# Patient Record
Sex: Female | Born: 1935
Health system: Southern US, Community
[De-identification: ages and names within clinical notes are randomized; demographics above are authoritative.]

## PROBLEM LIST (undated history)

## (undated) DIAGNOSIS — E119 Type 2 diabetes mellitus without complications: Secondary | ICD-10-CM

## (undated) DIAGNOSIS — I10 Essential (primary) hypertension: Secondary | ICD-10-CM

## (undated) DIAGNOSIS — E785 Hyperlipidemia, unspecified: Secondary | ICD-10-CM

## (undated) DIAGNOSIS — M359 Systemic involvement of connective tissue, unspecified: Secondary | ICD-10-CM

## (undated) DIAGNOSIS — J45909 Unspecified asthma, uncomplicated: Secondary | ICD-10-CM

## (undated) DIAGNOSIS — K219 Gastro-esophageal reflux disease without esophagitis: Secondary | ICD-10-CM

## (undated) DIAGNOSIS — C801 Malignant (primary) neoplasm, unspecified: Secondary | ICD-10-CM

## (undated) HISTORY — PX: CHOLECYSTECTOMY: SHX55

---

## 2010-08-31 ENCOUNTER — Emergency Department: Payer: Self-pay | Admitting: Emergency Medicine

## 2011-07-20 ENCOUNTER — Inpatient Hospital Stay: Payer: Self-pay | Admitting: Internal Medicine

## 2011-07-24 ENCOUNTER — Emergency Department: Payer: Self-pay | Admitting: Emergency Medicine

## 2011-12-27 ENCOUNTER — Emergency Department: Payer: Self-pay | Admitting: Emergency Medicine

## 2014-11-28 DIAGNOSIS — E1151 Type 2 diabetes mellitus with diabetic peripheral angiopathy without gangrene: Secondary | ICD-10-CM | POA: Diagnosis not present

## 2014-11-28 DIAGNOSIS — E119 Type 2 diabetes mellitus without complications: Secondary | ICD-10-CM | POA: Diagnosis not present

## 2014-11-28 DIAGNOSIS — I10 Essential (primary) hypertension: Secondary | ICD-10-CM | POA: Diagnosis not present

## 2015-01-12 DIAGNOSIS — E119 Type 2 diabetes mellitus without complications: Secondary | ICD-10-CM | POA: Diagnosis not present

## 2015-01-12 DIAGNOSIS — I10 Essential (primary) hypertension: Secondary | ICD-10-CM | POA: Diagnosis not present

## 2015-01-12 DIAGNOSIS — G309 Alzheimer's disease, unspecified: Secondary | ICD-10-CM | POA: Diagnosis not present

## 2015-01-12 DIAGNOSIS — K219 Gastro-esophageal reflux disease without esophagitis: Secondary | ICD-10-CM | POA: Diagnosis not present

## 2015-01-12 DIAGNOSIS — E785 Hyperlipidemia, unspecified: Secondary | ICD-10-CM | POA: Diagnosis not present

## 2015-02-06 DIAGNOSIS — D485 Neoplasm of uncertain behavior of skin: Secondary | ICD-10-CM | POA: Diagnosis not present

## 2015-02-06 DIAGNOSIS — D2339 Other benign neoplasm of skin of other parts of face: Secondary | ICD-10-CM | POA: Diagnosis not present

## 2015-02-06 DIAGNOSIS — L089 Local infection of the skin and subcutaneous tissue, unspecified: Secondary | ICD-10-CM | POA: Diagnosis not present

## 2015-04-04 DIAGNOSIS — R262 Difficulty in walking, not elsewhere classified: Secondary | ICD-10-CM | POA: Diagnosis not present

## 2015-04-04 DIAGNOSIS — E119 Type 2 diabetes mellitus without complications: Secondary | ICD-10-CM | POA: Diagnosis not present

## 2015-04-04 DIAGNOSIS — G309 Alzheimer's disease, unspecified: Secondary | ICD-10-CM | POA: Diagnosis not present

## 2015-04-04 DIAGNOSIS — I10 Essential (primary) hypertension: Secondary | ICD-10-CM | POA: Diagnosis not present

## 2015-04-04 DIAGNOSIS — K219 Gastro-esophageal reflux disease without esophagitis: Secondary | ICD-10-CM | POA: Diagnosis not present

## 2015-05-15 DIAGNOSIS — E1151 Type 2 diabetes mellitus with diabetic peripheral angiopathy without gangrene: Secondary | ICD-10-CM | POA: Diagnosis not present

## 2015-05-15 DIAGNOSIS — E119 Type 2 diabetes mellitus without complications: Secondary | ICD-10-CM | POA: Diagnosis not present

## 2015-05-15 DIAGNOSIS — Z79899 Other long term (current) drug therapy: Secondary | ICD-10-CM | POA: Diagnosis not present

## 2015-05-15 DIAGNOSIS — I10 Essential (primary) hypertension: Secondary | ICD-10-CM | POA: Diagnosis not present

## 2015-08-08 DIAGNOSIS — E119 Type 2 diabetes mellitus without complications: Secondary | ICD-10-CM | POA: Diagnosis not present

## 2015-08-08 DIAGNOSIS — I10 Essential (primary) hypertension: Secondary | ICD-10-CM | POA: Diagnosis not present

## 2015-08-08 DIAGNOSIS — R262 Difficulty in walking, not elsewhere classified: Secondary | ICD-10-CM | POA: Diagnosis not present

## 2015-08-08 DIAGNOSIS — I2510 Atherosclerotic heart disease of native coronary artery without angina pectoris: Secondary | ICD-10-CM | POA: Diagnosis not present

## 2015-08-08 DIAGNOSIS — K219 Gastro-esophageal reflux disease without esophagitis: Secondary | ICD-10-CM | POA: Diagnosis not present

## 2015-08-08 DIAGNOSIS — Z23 Encounter for immunization: Secondary | ICD-10-CM | POA: Diagnosis not present

## 2015-08-18 DIAGNOSIS — H2513 Age-related nuclear cataract, bilateral: Secondary | ICD-10-CM | POA: Diagnosis not present

## 2015-09-05 DIAGNOSIS — E119 Type 2 diabetes mellitus without complications: Secondary | ICD-10-CM | POA: Diagnosis not present

## 2015-09-05 DIAGNOSIS — I10 Essential (primary) hypertension: Secondary | ICD-10-CM | POA: Diagnosis not present

## 2015-09-05 DIAGNOSIS — E1151 Type 2 diabetes mellitus with diabetic peripheral angiopathy without gangrene: Secondary | ICD-10-CM | POA: Diagnosis not present

## 2015-09-05 DIAGNOSIS — M810 Age-related osteoporosis without current pathological fracture: Secondary | ICD-10-CM | POA: Diagnosis not present

## 2015-09-05 DIAGNOSIS — E785 Hyperlipidemia, unspecified: Secondary | ICD-10-CM | POA: Diagnosis not present

## 2015-09-08 ENCOUNTER — Ambulatory Visit: Payer: Self-pay | Admitting: Sports Medicine

## 2016-02-06 DIAGNOSIS — I10 Essential (primary) hypertension: Secondary | ICD-10-CM | POA: Diagnosis not present

## 2016-02-06 DIAGNOSIS — E119 Type 2 diabetes mellitus without complications: Secondary | ICD-10-CM | POA: Diagnosis not present

## 2016-02-06 DIAGNOSIS — R3121 Asymptomatic microscopic hematuria: Secondary | ICD-10-CM | POA: Diagnosis not present

## 2016-06-11 DIAGNOSIS — I10 Essential (primary) hypertension: Secondary | ICD-10-CM | POA: Diagnosis not present

## 2016-06-11 DIAGNOSIS — E1151 Type 2 diabetes mellitus with diabetic peripheral angiopathy without gangrene: Secondary | ICD-10-CM | POA: Diagnosis not present

## 2016-06-11 DIAGNOSIS — Z79899 Other long term (current) drug therapy: Secondary | ICD-10-CM | POA: Diagnosis not present

## 2016-06-11 DIAGNOSIS — E119 Type 2 diabetes mellitus without complications: Secondary | ICD-10-CM | POA: Diagnosis not present

## 2016-07-23 DIAGNOSIS — G4700 Insomnia, unspecified: Secondary | ICD-10-CM | POA: Diagnosis not present

## 2016-07-23 DIAGNOSIS — R609 Edema, unspecified: Secondary | ICD-10-CM | POA: Diagnosis not present

## 2016-07-23 DIAGNOSIS — E119 Type 2 diabetes mellitus without complications: Secondary | ICD-10-CM | POA: Diagnosis not present

## 2017-06-19 DIAGNOSIS — I2510 Atherosclerotic heart disease of native coronary artery without angina pectoris: Secondary | ICD-10-CM | POA: Diagnosis not present

## 2017-06-19 DIAGNOSIS — K219 Gastro-esophageal reflux disease without esophagitis: Secondary | ICD-10-CM | POA: Diagnosis not present

## 2017-06-19 DIAGNOSIS — I10 Essential (primary) hypertension: Secondary | ICD-10-CM | POA: Diagnosis not present

## 2017-06-19 DIAGNOSIS — G3184 Mild cognitive impairment, so stated: Secondary | ICD-10-CM | POA: Diagnosis not present

## 2017-06-19 DIAGNOSIS — E119 Type 2 diabetes mellitus without complications: Secondary | ICD-10-CM | POA: Diagnosis not present

## 2017-11-03 ENCOUNTER — Emergency Department: Payer: Medicare Other

## 2017-11-03 ENCOUNTER — Other Ambulatory Visit: Payer: Self-pay

## 2017-11-03 ENCOUNTER — Encounter: Payer: Self-pay | Admitting: *Deleted

## 2017-11-03 ENCOUNTER — Inpatient Hospital Stay
Admission: EM | Admit: 2017-11-03 | Discharge: 2017-11-10 | DRG: 690 | Disposition: A | Discharge status: Skilled Nursing Facility | Payer: Medicare Other | Attending: Internal Medicine | Admitting: Internal Medicine

## 2017-11-03 DIAGNOSIS — R748 Abnormal levels of other serum enzymes: Secondary | ICD-10-CM | POA: Diagnosis not present

## 2017-11-03 DIAGNOSIS — Z880 Allergy status to penicillin: Secondary | ICD-10-CM

## 2017-11-03 DIAGNOSIS — K219 Gastro-esophageal reflux disease without esophagitis: Secondary | ICD-10-CM | POA: Diagnosis not present

## 2017-11-03 DIAGNOSIS — I5030 Unspecified diastolic (congestive) heart failure: Secondary | ICD-10-CM | POA: Diagnosis not present

## 2017-11-03 DIAGNOSIS — R410 Disorientation, unspecified: Secondary | ICD-10-CM | POA: Diagnosis not present

## 2017-11-03 DIAGNOSIS — Z79899 Other long term (current) drug therapy: Secondary | ICD-10-CM | POA: Diagnosis not present

## 2017-11-03 DIAGNOSIS — I10 Essential (primary) hypertension: Secondary | ICD-10-CM | POA: Diagnosis present

## 2017-11-03 DIAGNOSIS — M793 Panniculitis, unspecified: Secondary | ICD-10-CM | POA: Diagnosis present

## 2017-11-03 DIAGNOSIS — R2681 Unsteadiness on feet: Secondary | ICD-10-CM | POA: Diagnosis not present

## 2017-11-03 DIAGNOSIS — Z91013 Allergy to seafood: Secondary | ICD-10-CM

## 2017-11-03 DIAGNOSIS — Z794 Long term (current) use of insulin: Secondary | ICD-10-CM | POA: Diagnosis not present

## 2017-11-03 DIAGNOSIS — R1312 Dysphagia, oropharyngeal phase: Secondary | ICD-10-CM | POA: Diagnosis not present

## 2017-11-03 DIAGNOSIS — M359 Systemic involvement of connective tissue, unspecified: Secondary | ICD-10-CM | POA: Diagnosis present

## 2017-11-03 DIAGNOSIS — I69311 Memory deficit following cerebral infarction: Secondary | ICD-10-CM | POA: Diagnosis not present

## 2017-11-03 DIAGNOSIS — R7989 Other specified abnormal findings of blood chemistry: Secondary | ICD-10-CM | POA: Diagnosis not present

## 2017-11-03 DIAGNOSIS — E119 Type 2 diabetes mellitus without complications: Secondary | ICD-10-CM | POA: Diagnosis not present

## 2017-11-03 DIAGNOSIS — Z23 Encounter for immunization: Secondary | ICD-10-CM

## 2017-11-03 DIAGNOSIS — N390 Urinary tract infection, site not specified: Secondary | ICD-10-CM | POA: Diagnosis not present

## 2017-11-03 DIAGNOSIS — Z7401 Bed confinement status: Secondary | ICD-10-CM | POA: Diagnosis not present

## 2017-11-03 DIAGNOSIS — Z6841 Body Mass Index (BMI) 40.0 and over, adult: Secondary | ICD-10-CM

## 2017-11-03 DIAGNOSIS — B372 Candidiasis of skin and nail: Secondary | ICD-10-CM | POA: Diagnosis present

## 2017-11-03 DIAGNOSIS — E785 Hyperlipidemia, unspecified: Secondary | ICD-10-CM | POA: Diagnosis not present

## 2017-11-03 DIAGNOSIS — F0150 Vascular dementia without behavioral disturbance: Secondary | ICD-10-CM | POA: Diagnosis present

## 2017-11-03 DIAGNOSIS — R778 Other specified abnormalities of plasma proteins: Secondary | ICD-10-CM

## 2017-11-03 DIAGNOSIS — R4020 Unspecified coma: Secondary | ICD-10-CM | POA: Diagnosis not present

## 2017-11-03 DIAGNOSIS — B379 Candidiasis, unspecified: Secondary | ICD-10-CM | POA: Diagnosis present

## 2017-11-03 DIAGNOSIS — J45909 Unspecified asthma, uncomplicated: Secondary | ICD-10-CM | POA: Diagnosis not present

## 2017-11-03 DIAGNOSIS — R0602 Shortness of breath: Secondary | ICD-10-CM | POA: Diagnosis not present

## 2017-11-03 DIAGNOSIS — R4182 Altered mental status, unspecified: Secondary | ICD-10-CM

## 2017-11-03 DIAGNOSIS — R21 Rash and other nonspecific skin eruption: Secondary | ICD-10-CM | POA: Diagnosis not present

## 2017-11-03 HISTORY — DX: Malignant (primary) neoplasm, unspecified: C80.1

## 2017-11-03 HISTORY — DX: Type 2 diabetes mellitus without complications: E11.9

## 2017-11-03 HISTORY — DX: Essential (primary) hypertension: I10

## 2017-11-03 HISTORY — DX: Hyperlipidemia, unspecified: E78.5

## 2017-11-03 HISTORY — DX: Systemic involvement of connective tissue, unspecified: M35.9

## 2017-11-03 HISTORY — DX: Gastro-esophageal reflux disease without esophagitis: K21.9

## 2017-11-03 HISTORY — DX: Unspecified asthma, uncomplicated: J45.909

## 2017-11-03 LAB — BASIC METABOLIC PANEL
Anion gap: 10 (ref 5–15)
BUN: 21 mg/dL — AB (ref 6–20)
CO2: 26 mmol/L (ref 22–32)
CREATININE: 0.78 mg/dL (ref 0.44–1.00)
Calcium: 9.2 mg/dL (ref 8.9–10.3)
Chloride: 107 mmol/L (ref 101–111)
GFR calc Af Amer: >60 mL/min (ref >60)
GFR calc non Af Amer: >60 mL/min (ref >60)
GLUCOSE: 124 mg/dL — AB (ref 65–99)
Potassium: 4.2 mmol/L (ref 3.5–5.1)
Sodium: 143 mmol/L (ref 135–145)

## 2017-11-03 LAB — URINALYSIS, COMPLETE (UACMP) WITH MICROSCOPIC
BILIRUBIN URINE: NEGATIVE
Glucose, UA: NEGATIVE mg/dL
Hgb urine dipstick: NEGATIVE
KETONES UR: NEGATIVE mg/dL
Nitrite: NEGATIVE
Protein, ur: 30 mg/dL — AB
SPECIFIC GRAVITY, URINE: 1.026 (ref 1.005–1.030)
pH: 5 (ref 5.0–8.0)

## 2017-11-03 LAB — DIFFERENTIAL
BASOS ABS: 0.1 10*3/uL (ref 0–0.1)
BASOS PCT: 1 %
EOS ABS: 0.2 10*3/uL (ref 0–0.7)
Eosinophils Relative: 1 %
LYMPHS ABS: 2.3 10*3/uL (ref 1.0–3.6)
Lymphocytes Relative: 19 %
Monocytes Absolute: 1 10*3/uL — ABNORMAL HIGH (ref 0.2–0.9)
Monocytes Relative: 8 %
NEUTROS PCT: 71 %
Neutro Abs: 8.8 10*3/uL — ABNORMAL HIGH (ref 1.4–6.5)

## 2017-11-03 LAB — CBC
HCT: 36.7 % (ref 35.0–47.0)
Hemoglobin: 12.1 g/dL (ref 12.0–16.0)
MCH: 29.7 pg (ref 26.0–34.0)
MCHC: 32.8 g/dL (ref 32.0–36.0)
MCV: 90.3 fL (ref 80.0–100.0)
PLATELETS: 300 10*3/uL (ref 150–440)
RBC: 4.07 MIL/uL (ref 3.80–5.20)
RDW: 14.9 % — AB (ref 11.5–14.5)
WBC: 12 10*3/uL — ABNORMAL HIGH (ref 3.6–11.0)

## 2017-11-03 LAB — HEPATIC FUNCTION PANEL
ALT: 11 U/L — AB (ref 14–54)
AST: 22 U/L (ref 15–41)
Albumin: 3 g/dL — ABNORMAL LOW (ref 3.5–5.0)
Alkaline Phosphatase: 57 U/L (ref 38–126)
Total Bilirubin: 0.6 mg/dL (ref 0.3–1.2)
Total Protein: 6.5 g/dL (ref 6.5–8.1)

## 2017-11-03 LAB — TROPONIN I: Troponin I: 0.09 ng/mL (ref <0.03)

## 2017-11-03 LAB — LIPASE, BLOOD: LIPASE: 20 U/L (ref 11–51)

## 2017-11-03 LAB — BRAIN NATRIURETIC PEPTIDE: B Natriuretic Peptide: 436 pg/mL — ABNORMAL HIGH (ref 0.0–100.0)

## 2017-11-03 MED ORDER — IOPAMIDOL (ISOVUE-370) INJECTION 76%
100.0000 mL | Freq: Once | INTRAVENOUS | Status: AC | PRN
  Administered 2017-11-03: 100 mL via INTRAVENOUS

## 2017-11-03 MED ORDER — IOPAMIDOL (ISOVUE-300) INJECTION 61%
30.0000 mL | Freq: Once | INTRAVENOUS | Status: AC
  Administered 2017-11-03: 30 mL via ORAL

## 2017-11-03 MED ORDER — LORAZEPAM 2 MG/ML IJ SOLN
1.0000 mg | Freq: Once | INTRAMUSCULAR | Status: AC
  Administered 2017-11-03: 1 mg via INTRAVENOUS

## 2017-11-03 MED ORDER — LORAZEPAM 2 MG/ML IJ SOLN
INTRAMUSCULAR | Status: AC
  Administered 2017-11-03: 1 mg via INTRAVENOUS
  Filled 2017-11-03: qty 1

## 2017-11-03 MED ORDER — CIPROFLOXACIN IN D5W 400 MG/200ML IV SOLN
400.0000 mg | Freq: Once | INTRAVENOUS | Status: AC
  Administered 2017-11-03: 400 mg via INTRAVENOUS
  Filled 2017-11-03: qty 200

## 2017-11-03 NOTE — ED Notes (Signed)
This Rn received call from MRI, stating they were unable to get abd scan as pt was not agreeable, and they obtained a head but quality is not good. EDP aware.

## 2017-11-03 NOTE — ED Provider Notes (Addendum)
[facility] Emergency Department Provider Note   ____________________________________________   First MD Initiated Contact with Patient 11/03/17 2001     (approximate)  I have reviewed the triage vital signs and the nursing notes.   HISTORY  Chief Complaint Altered Mental Status and Back Pain    HPI Heidi Hanna is a 84 y.o. female Patient complaining of low back pain in the right SI joint area for about a month. Patient has a intermittent and currently fairly severe yeast rash in her under her panniculus. Patient has not been eating well for about a week is not acting like herself per her daughter. She is been intermittently confused for about a week. She has a chronic dry cough from her medication per her daughter. She is not running a fever she is not vomiting not complaining of any other pain.   Past Medical History:  Diagnosis Date  . Asthma   . Cancer ([place])   . Collagen vascular disease ([place])   . Diabetes mellitus without complication ([place])   . Hypertension     There are no active problems to display for this patient.   History reviewed. No pertinent surgical history.  Prior to Admission medications   Not on File    Allergies Shellfish allergy and Penicillins  History reviewed. No pertinent family history.  Social History Social History   Tobacco Use  . Smoking status: Never Smoker  . Smokeless tobacco: Never Used  Substance Use Topics  . Alcohol use: No    Frequency: Never  . Drug use: No    Review of Systems  Constitutional: No fever/chills Eyes: No visual changes. ENT: No sore throat. Cardiovascular: Denies chest pain. Respiratory: Denies shortness of breath. Gastrointestinal: No abdominal pain, no vomiting.  No diarrhea.  No constipation. Genitourinary: Negative for dysuria. Musculoskeletal:  back pain. Skin:  rash. Neurological: Negative for headaches, focal  weakness  ____________________________________________   PHYSICAL EXAM:  VITAL SIGNS: ED Triage Vitals [11/03/17 1638]  Enc Vitals Group     BP 134/60     Pulse Rate 61     Resp      Temp 97.6 F (36.4 C)     Temp Source Oral     SpO2 96 %     Weight      Height 5\' 3"  (1.6 m)     Head Circumference      Peak Flow      Pain Score      Pain Loc      Pain Edu?      Excl. in [place]?     Constitutional: Alert and oriented. Well appearing and in no acute distress. Eyes: Conjunctivae are normal.  Head: Atraumatic. Nose: No congestion/rhinnorhea. Mouth/Throat: Mucous membranes are moist.  Oropharynx non-erythematous. Neck: No stridor.  Cardiovascular: Normal rate, regular rhythm. Grossly normal heart sounds.  Good peripheral circulation. Respiratory: Normal respiratory effort.  No retractions. Lungs CTAB. Gastrointestinal: Soft and nontender. No distention. No abdominal bruits. No CVA tenderness. Musculoskeletal: No lower extremity tenderness nor edema.  No joint effusions. Neurologic:  Normal speech and language. No gross focal neurologic deficits are appreciated.  Skin:  patient has rash is described in history of present illness Psychiatric: Mood and affect are normal. Speech and behavior are normal.  ____________________________________________   LABS (all labs ordered are listed, but only abnormal results are displayed)  Labs Reviewed  BASIC METABOLIC PANEL - Abnormal; Notable for the following components:      Result Value  Glucose, Bld 124 (*)    BUN 21 (*)    All other components within normal limits  CBC - Abnormal; Notable for the following components:   WBC 12.0 (*)    RDW 14.9 (*)    All other components within normal limits  HEPATIC FUNCTION PANEL - Abnormal; Notable for the following components:   Albumin 3.0 (*)    ALT 11 (*)    Bilirubin, Direct <0.1 (*)    All other components within normal limits  TROPONIN I - Abnormal; Notable for the following  components:   Troponin I 0.09 (*)    All other components within normal limits  BRAIN NATRIURETIC PEPTIDE - Abnormal; Notable for the following components:   B Natriuretic Peptide 436.0 (*)    All other components within normal limits  URINALYSIS, COMPLETE (UACMP) WITH MICROSCOPIC - Abnormal; Notable for the following components:   Color, Urine AMBER (*)    APPearance TURBID (*)    Protein, ur 30 (*)    Leukocytes, UA LARGE (*)    Bacteria, UA MANY (*)    Squamous Epithelial / LPF TOO NUMEROUS TO COUNT (*)    All other components within normal limits  DIFFERENTIAL - Abnormal; Notable for the following components:   Neutro Abs 8.8 (*)    Monocytes Absolute 1.0 (*)    All other components within normal limits  CULTURE, BLOOD (ROUTINE X 2)  CULTURE, BLOOD (ROUTINE X 2)  URINE CULTURE  LIPASE, BLOOD   ____________________________________________  EKG  EKG read and interpreted by me shows normal sinus rhythm rate of 77 normal axis there are some PVCs present but no acute ST-T wave changes ____________________________________________  RADIOLOGY  ichest x-ray read as no acute disease _______CT of the head deflated by motion but shows no acute disease_____________________________________   PROCEDURES   Procedures  Critical Care performed:   ____________________________________________   INITIAL IMPRESSION / ASSESSMENT AND PLAN / ED COURSE  patient able to tolerate CT scan even with Ativan on board. We'll admit her and treat her UTI. The CT was mainly to help discover the etiology of her right sacroiliac area pain.we will also evaluate the elevated troponin and the altered mental status.      ____________________________________________   FINAL CLINICAL IMPRESSION(S) / ED DIAGNOSES  Final diagnoses:  Urinary tract infection without hematuria, site unspecified  Altered mental status, unspecified altered mental status type  Elevated troponin  Elevated brain  natriuretic peptide (BNP) level     ED Discharge Orders    None       Note:  This document was prepared using Dragon voice recognition software and may include unintentional dictation errors.    Nena Polio, MD 11/03/17 2330    Nena Polio, MD 11/03/17 [phone]

## 2017-11-03 NOTE — ED Notes (Signed)
Date and time results received: 11/03/17 2108   Test: Troponin Critical Value: 0.09  Name of Provider Notified: Cinda Quest

## 2017-11-03 NOTE — ED Triage Notes (Signed)
Pt brought in via wheelchair.  Daughter with pt.  Daughter states pt has low back pain.  No injury to back no urinary sx. Daughter states pt has periods of confusion for 1 week.  Pt alert in triage.

## 2017-11-03 NOTE — ED Notes (Signed)
notified lab of add on

## 2017-11-03 NOTE — ED Notes (Signed)
ED Provider at bedside. 

## 2017-11-03 NOTE — ED Notes (Signed)
This Rn spoke with Alisha in Lab and notified of urine culture order that was just placed.

## 2017-11-03 NOTE — ED Notes (Addendum)
Pt was assisted with bedpan.

## 2017-11-03 NOTE — ED Notes (Signed)
Patient transported to CT 

## 2017-11-04 ENCOUNTER — Other Ambulatory Visit: Payer: Self-pay

## 2017-11-04 ENCOUNTER — Inpatient Hospital Stay: Admit: 2017-11-04 | Payer: Medicare Other

## 2017-11-04 ENCOUNTER — Encounter: Payer: Self-pay | Admitting: Internal Medicine

## 2017-11-04 DIAGNOSIS — Z794 Long term (current) use of insulin: Secondary | ICD-10-CM | POA: Diagnosis not present

## 2017-11-04 DIAGNOSIS — Z6841 Body Mass Index (BMI) 40.0 and over, adult: Secondary | ICD-10-CM | POA: Diagnosis not present

## 2017-11-04 DIAGNOSIS — K219 Gastro-esophageal reflux disease without esophagitis: Secondary | ICD-10-CM | POA: Diagnosis present

## 2017-11-04 DIAGNOSIS — B372 Candidiasis of skin and nail: Secondary | ICD-10-CM | POA: Diagnosis present

## 2017-11-04 DIAGNOSIS — F0150 Vascular dementia without behavioral disturbance: Secondary | ICD-10-CM | POA: Diagnosis present

## 2017-11-04 DIAGNOSIS — Z880 Allergy status to penicillin: Secondary | ICD-10-CM | POA: Diagnosis not present

## 2017-11-04 DIAGNOSIS — I10 Essential (primary) hypertension: Secondary | ICD-10-CM | POA: Diagnosis present

## 2017-11-04 DIAGNOSIS — Z23 Encounter for immunization: Secondary | ICD-10-CM | POA: Diagnosis not present

## 2017-11-04 DIAGNOSIS — R748 Abnormal levels of other serum enzymes: Secondary | ICD-10-CM | POA: Diagnosis present

## 2017-11-04 DIAGNOSIS — E119 Type 2 diabetes mellitus without complications: Secondary | ICD-10-CM

## 2017-11-04 DIAGNOSIS — B379 Candidiasis, unspecified: Secondary | ICD-10-CM | POA: Diagnosis present

## 2017-11-04 DIAGNOSIS — N390 Urinary tract infection, site not specified: Secondary | ICD-10-CM | POA: Diagnosis present

## 2017-11-04 DIAGNOSIS — M359 Systemic involvement of connective tissue, unspecified: Secondary | ICD-10-CM | POA: Diagnosis present

## 2017-11-04 DIAGNOSIS — I5030 Unspecified diastolic (congestive) heart failure: Secondary | ICD-10-CM | POA: Diagnosis not present

## 2017-11-04 DIAGNOSIS — I69311 Memory deficit following cerebral infarction: Secondary | ICD-10-CM | POA: Diagnosis not present

## 2017-11-04 DIAGNOSIS — M793 Panniculitis, unspecified: Secondary | ICD-10-CM

## 2017-11-04 DIAGNOSIS — Z79899 Other long term (current) drug therapy: Secondary | ICD-10-CM | POA: Diagnosis not present

## 2017-11-04 DIAGNOSIS — E785 Hyperlipidemia, unspecified: Secondary | ICD-10-CM | POA: Diagnosis present

## 2017-11-04 DIAGNOSIS — Z91013 Allergy to seafood: Secondary | ICD-10-CM | POA: Diagnosis not present

## 2017-11-04 DIAGNOSIS — J45909 Unspecified asthma, uncomplicated: Secondary | ICD-10-CM | POA: Diagnosis present

## 2017-11-04 DIAGNOSIS — R7989 Other specified abnormal findings of blood chemistry: Secondary | ICD-10-CM | POA: Diagnosis present

## 2017-11-04 LAB — GLUCOSE, CAPILLARY
GLUCOSE-CAPILLARY: 61 mg/dL — AB (ref 65–99)
GLUCOSE-CAPILLARY: 124 mg/dL — AB (ref 65–99)
GLUCOSE-CAPILLARY: 149 mg/dL — AB (ref 65–99)
Glucose-Capillary: 130 mg/dL — ABNORMAL HIGH (ref 65–99)
Glucose-Capillary: 135 mg/dL — ABNORMAL HIGH (ref 65–99)

## 2017-11-04 LAB — BLOOD CULTURE ID PANEL (REFLEXED)
ACINETOBACTER BAUMANNII: NOT DETECTED
CANDIDA GLABRATA: NOT DETECTED
CANDIDA PARAPSILOSIS: NOT DETECTED
Candida albicans: NOT DETECTED
Candida krusei: NOT DETECTED
Candida tropicalis: NOT DETECTED
Enterobacter cloacae complex: NOT DETECTED
Enterobacteriaceae species: NOT DETECTED
Enterococcus species: NOT DETECTED
Escherichia coli: NOT DETECTED
HAEMOPHILUS INFLUENZAE: NOT DETECTED
KLEBSIELLA OXYTOCA: NOT DETECTED
KLEBSIELLA PNEUMONIAE: NOT DETECTED
Listeria monocytogenes: NOT DETECTED
METHICILLIN RESISTANCE: NOT DETECTED
NEISSERIA MENINGITIDIS: NOT DETECTED
PSEUDOMONAS AERUGINOSA: NOT DETECTED
Proteus species: NOT DETECTED
STREPTOCOCCUS PNEUMONIAE: NOT DETECTED
STREPTOCOCCUS PYOGENES: NOT DETECTED
STREPTOCOCCUS SPECIES: NOT DETECTED
Serratia marcescens: NOT DETECTED
Staphylococcus aureus (BCID): NOT DETECTED
Staphylococcus species: DETECTED — AB
Streptococcus agalactiae: NOT DETECTED

## 2017-11-04 LAB — BASIC METABOLIC PANEL
Anion gap: 9 (ref 5–15)
BUN: 16 mg/dL (ref 6–20)
CALCIUM: 8.5 mg/dL — AB (ref 8.9–10.3)
CHLORIDE: 103 mmol/L (ref 101–111)
CO2: 27 mmol/L (ref 22–32)
CREATININE: 0.68 mg/dL (ref 0.44–1.00)
GFR calc non Af Amer: >60 mL/min (ref >60)
GLUCOSE: 72 mg/dL (ref 65–99)
Potassium: 3.3 mmol/L — ABNORMAL LOW (ref 3.5–5.1)
Sodium: 139 mmol/L (ref 135–145)

## 2017-11-04 LAB — CBC
HCT: 34.5 % — ABNORMAL LOW (ref 35.0–47.0)
Hemoglobin: 11.5 g/dL — ABNORMAL LOW (ref 12.0–16.0)
MCH: 30 pg (ref 26.0–34.0)
MCHC: 33.4 g/dL (ref 32.0–36.0)
MCV: 90 fL (ref 80.0–100.0)
PLATELETS: 306 10*3/uL (ref 150–440)
RBC: 3.83 MIL/uL (ref 3.80–5.20)
RDW: 14.8 % — AB (ref 11.5–14.5)
WBC: 12.5 10*3/uL — ABNORMAL HIGH (ref 3.6–11.0)

## 2017-11-04 MED ORDER — MORPHINE SULFATE (PF) 2 MG/ML IV SOLN: 2.0000 mg | Freq: Once | INTRAVENOUS | Status: DC

## 2017-11-04 MED ORDER — MORPHINE SULFATE (PF) 2 MG/ML IV SOLN
INTRAVENOUS | Status: AC
  Filled 2017-11-04: qty 1

## 2017-11-04 MED ORDER — ENOXAPARIN SODIUM 40 MG/0.4ML ~~LOC~~ SOLN
40.0000 mg | SUBCUTANEOUS | Status: DC
  Administered 2017-11-04: 40 mg via SUBCUTANEOUS
  Filled 2017-11-04: qty 0.4

## 2017-11-04 MED ORDER — ONDANSETRON HCL 4 MG/2ML IJ SOLN: 4.0000 mg | Freq: Four times a day (QID) | INTRAMUSCULAR | Status: DC | PRN

## 2017-11-04 MED ORDER — POTASSIUM CHLORIDE 20 MEQ PO PACK
40.0000 meq | PACK | Freq: Once | ORAL | Status: AC
  Administered 2017-11-04: 40 meq via ORAL
  Filled 2017-11-04: qty 2

## 2017-11-04 MED ORDER — CEFAZOLIN SODIUM-DEXTROSE 2-4 GM/100ML-% IV SOLN
2.0000 g | Freq: Three times a day (TID) | INTRAVENOUS | Status: DC
  Administered 2017-11-05 – 2017-11-07 (×7): 2 g via INTRAVENOUS
  Filled 2017-11-04 (×11): qty 100

## 2017-11-04 MED ORDER — HYDRALAZINE HCL 25 MG PO TABS
25.0000 mg | ORAL_TABLET | Freq: Two times a day (BID) | ORAL | Status: DC
  Administered 2017-11-04 – 2017-11-10 (×12): 25 mg via ORAL
  Filled 2017-11-04 (×13): qty 1

## 2017-11-04 MED ORDER — PREMIER PROTEIN SHAKE
11.0000 [oz_av] | Freq: Two times a day (BID) | ORAL | Status: DC
  Administered 2017-11-04 – 2017-11-10 (×13): 11 [oz_av] via ORAL

## 2017-11-04 MED ORDER — MORPHINE SULFATE (PF) 4 MG/ML IV SOLN
4.0000 mg | INTRAVENOUS | Status: DC | PRN
  Administered 2017-11-09: 4 mg via INTRAVENOUS
  Filled 2017-11-04: qty 1

## 2017-11-04 MED ORDER — NYSTATIN 100000 UNIT/GM EX POWD
Freq: Three times a day (TID) | CUTANEOUS | Status: DC
  Administered 2017-11-04 – 2017-11-05 (×5): via TOPICAL
  Filled 2017-11-04 (×2): qty 15

## 2017-11-04 MED ORDER — ADULT MULTIVITAMIN W/MINERALS CH
1.0000 | ORAL_TABLET | Freq: Every day | ORAL | Status: DC
  Administered 2017-11-05 – 2017-11-10 (×6): 1 via ORAL
  Filled 2017-11-04 (×6): qty 1

## 2017-11-04 MED ORDER — ONDANSETRON HCL 4 MG PO TABS: 4.0000 mg | ORAL_TABLET | Freq: Four times a day (QID) | ORAL | Status: DC | PRN

## 2017-11-04 MED ORDER — OXYCODONE HCL 5 MG PO TABS
5.0000 mg | ORAL_TABLET | ORAL | Status: DC | PRN
  Administered 2017-11-04 – 2017-11-08 (×6): 5 mg via ORAL
  Filled 2017-11-04 (×6): qty 1

## 2017-11-04 MED ORDER — DEXTROSE 5 % IV SOLN
1.0000 g | INTRAVENOUS | Status: DC
  Administered 2017-11-04: 1 g via INTRAVENOUS
  Filled 2017-11-04 (×3): qty 10

## 2017-11-04 MED ORDER — LOSARTAN POTASSIUM 50 MG PO TABS
100.0000 mg | ORAL_TABLET | Freq: Every day | ORAL | Status: DC
  Administered 2017-11-04 – 2017-11-10 (×7): 100 mg via ORAL
  Filled 2017-11-04 (×7): qty 2

## 2017-11-04 MED ORDER — DEXTROSE 5 % IV SOLN
2.0000 g | Freq: Three times a day (TID) | INTRAVENOUS | Status: DC
  Filled 2017-11-04 (×2): qty 2000

## 2017-11-04 MED ORDER — INSULIN ASPART 100 UNIT/ML ~~LOC~~ SOLN
0.0000 [IU] | Freq: Every day | SUBCUTANEOUS | Status: DC
  Administered 2017-11-06: 3 [IU] via SUBCUTANEOUS
  Administered 2017-11-07 – 2017-11-09 (×2): 2 [IU] via SUBCUTANEOUS
  Filled 2017-11-04 (×3): qty 1

## 2017-11-04 MED ORDER — INSULIN LISPRO 100 UNIT/ML (KWIKPEN): 5.0000 [IU] | PEN_INJECTOR | Freq: Three times a day (TID) | SUBCUTANEOUS | Status: DC

## 2017-11-04 MED ORDER — INFLUENZA VAC SPLIT HIGH-DOSE 0.5 ML IM SUSY
0.5000 mL | PREFILLED_SYRINGE | INTRAMUSCULAR | Status: AC
  Administered 2017-11-08: 0.5 mL via INTRAMUSCULAR
  Filled 2017-11-04: qty 0.5

## 2017-11-04 MED ORDER — METOPROLOL SUCCINATE ER 50 MG PO TB24
200.0000 mg | ORAL_TABLET | Freq: Every day | ORAL | Status: DC
  Administered 2017-11-04 – 2017-11-09 (×5): 200 mg via ORAL
  Filled 2017-11-04 (×7): qty 4

## 2017-11-04 MED ORDER — MORPHINE SULFATE (PF) 4 MG/ML IV SOLN
4.0000 mg | Freq: Once | INTRAVENOUS | Status: AC
  Administered 2017-11-04: 4 mg via INTRAVENOUS

## 2017-11-04 MED ORDER — INSULIN ASPART 100 UNIT/ML ~~LOC~~ SOLN
0.0000 [IU] | Freq: Three times a day (TID) | SUBCUTANEOUS | Status: DC
  Administered 2017-11-04 (×2): 1 [IU] via SUBCUTANEOUS
  Administered 2017-11-05 (×3): 2 [IU] via SUBCUTANEOUS
  Administered 2017-11-06: 3 [IU] via SUBCUTANEOUS
  Administered 2017-11-06: 1 [IU] via SUBCUTANEOUS
  Administered 2017-11-06: 5 [IU] via SUBCUTANEOUS
  Administered 2017-11-07: 3 [IU] via SUBCUTANEOUS
  Administered 2017-11-07 (×2): 2 [IU] via SUBCUTANEOUS
  Administered 2017-11-08: 3 [IU] via SUBCUTANEOUS
  Administered 2017-11-08 – 2017-11-09 (×3): 2 [IU] via SUBCUTANEOUS
  Administered 2017-11-09: 3 [IU] via SUBCUTANEOUS
  Administered 2017-11-09: 0.3 [IU] via SUBCUTANEOUS
  Administered 2017-11-10 (×2): 2 [IU] via SUBCUTANEOUS
  Filled 2017-11-04 (×19): qty 1

## 2017-11-04 MED ORDER — AMLODIPINE BESYLATE 10 MG PO TABS
10.0000 mg | ORAL_TABLET | Freq: Every day | ORAL | Status: DC
  Administered 2017-11-04 – 2017-11-10 (×7): 10 mg via ORAL
  Filled 2017-11-04 (×7): qty 1

## 2017-11-04 MED ORDER — FLUCONAZOLE 150 MG PO TABS
150.0000 mg | ORAL_TABLET | Freq: Every day | ORAL | Status: AC
  Administered 2017-11-04 – 2017-11-05 (×2): 150 mg via ORAL
  Filled 2017-11-04 (×2): qty 1

## 2017-11-04 MED ORDER — PANTOPRAZOLE SODIUM 40 MG PO TBEC
40.0000 mg | DELAYED_RELEASE_TABLET | Freq: Every day | ORAL | Status: DC
  Administered 2017-11-04 – 2017-11-10 (×7): 40 mg via ORAL
  Filled 2017-11-04 (×7): qty 1

## 2017-11-04 MED ORDER — ACETAMINOPHEN 650 MG RE SUPP: 650.0000 mg | Freq: Four times a day (QID) | RECTAL | Status: DC | PRN

## 2017-11-04 MED ORDER — INSULIN GLARGINE 100 UNIT/ML ~~LOC~~ SOLN
10.0000 [IU] | Freq: Two times a day (BID) | SUBCUTANEOUS | Status: DC
  Administered 2017-11-04 – 2017-11-10 (×13): 10 [IU] via SUBCUTANEOUS
  Filled 2017-11-04 (×14): qty 0.1

## 2017-11-04 MED ORDER — GABAPENTIN 100 MG PO CAPS
100.0000 mg | ORAL_CAPSULE | Freq: Two times a day (BID) | ORAL | Status: DC
  Administered 2017-11-05 – 2017-11-09 (×9): 100 mg via ORAL
  Filled 2017-11-04 (×9): qty 1

## 2017-11-04 MED ORDER — PRAVASTATIN SODIUM 20 MG PO TABS
80.0000 mg | ORAL_TABLET | Freq: Every day | ORAL | Status: DC
  Administered 2017-11-04 – 2017-11-09 (×6): 80 mg via ORAL
  Filled 2017-11-04 (×6): qty 4

## 2017-11-04 MED ORDER — MORPHINE SULFATE (PF) 4 MG/ML IV SOLN
INTRAVENOUS | Status: AC
  Filled 2017-11-04: qty 1

## 2017-11-04 MED ORDER — ENOXAPARIN SODIUM 40 MG/0.4ML ~~LOC~~ SOLN
40.0000 mg | Freq: Two times a day (BID) | SUBCUTANEOUS | Status: DC
  Administered 2017-11-04 – 2017-11-10 (×12): 40 mg via SUBCUTANEOUS
  Filled 2017-11-04 (×12): qty 0.4

## 2017-11-04 MED ORDER — ACETAMINOPHEN 325 MG PO TABS
650.0000 mg | ORAL_TABLET | Freq: Four times a day (QID) | ORAL | Status: DC | PRN
  Administered 2017-11-05: 650 mg via ORAL
  Filled 2017-11-04: qty 2

## 2017-11-04 MED ORDER — INSULIN ASPART 100 UNIT/ML ~~LOC~~ SOLN
5.0000 [IU] | Freq: Three times a day (TID) | SUBCUTANEOUS | Status: DC
  Administered 2017-11-09: 5 [IU] via SUBCUTANEOUS
  Filled 2017-11-04 (×2): qty 1

## 2017-11-04 NOTE — ED Notes (Signed)
Pt transported by Daiva Nakayama, RN and Mayra, EDT to floor. Family following with belongings.

## 2017-11-04 NOTE — Progress Notes (Addendum)
Initial Nutrition Assessment  DOCUMENTATION CODES:   Morbid obesity  INTERVENTION:   Premier Protein BID, each supplement provides 160 kcal and 30 grams of protein.   MVI daily  Dysphagia 3 diet  NUTRITION DIAGNOSIS:   Inadequate oral intake related to acute illness as evidenced by meal completion < 25%.  GOAL:   Patient will meet greater than or equal to 90% of their needs  MONITOR:   PO intake, Supplement acceptance, Weight trends, Labs, I & O's, Skin  REASON FOR ASSESSMENT:   Malnutrition Screening Tool    ASSESSMENT:   84 y.o. female who presents with confusion, decreased appetite, complaint of back pain, panniculitis.    Met with pt and pt's family in room today. Unable to speak with pt r/t AMS so history obtained from pt's daughter at bedside. Pt's daughter reports pt with good appetite and oral intake up until 1 week pta. Pt lives with her daughter who prepares her meals and provides her medications. Pt requires a chopped diet at home. Pt also takes 1053m vitamin C daily and 5000IU vitamin D every 3 days. Pt used to drink supplements but per daughters report, pt is "tired of them". Per family report, pt is weight stable. RD discussed with pt the importance of adequate protein intake to preserve lean muscle and prevent skin breakdown. Daughter would like to try Premier Protein for pt. RD will also order MVI and change pt to a dysphagia 3 diet. Pt currently eating <25% of meals in hospital.    Medications reviewed and include: lovenox, insulin, protonix, ceftriaxone, oxycodone   Labs reviewed: K 3.3(L), Ca 8.5(L) BNP- 436(H)- 1/14 Wbc- 12.5(H)  Nutrition-Focused physical exam completed. Findings are no fat depletion, no muscle depletion, and no edema.   Diet Order:  Diet heart healthy/carb modified Room service appropriate? Yes; Fluid consistency: Thin  EDUCATION NEEDS:   Education needs have been addressed(with family )  Skin:  Skin Assessment: (MASD, rash  )  Last BM:  1/13  Height:   Ht Readings from Last 1 Encounters:  11/03/17 _0  (1.6 m)    Weight:   Wt Readings from Last 1 Encounters:  11/04/17 230 lb (104.3 kg)    Ideal Body Weight:  52.3 kg  BMI:  Body mass index is 40.74 kg/m.  Estimated Nutritional Needs:   Kcal:  1600-1900kcal/day   Protein:  104-115g/day   Fluid:  >1.6L/day   CKoleen DistanceMS, RD, LDN Pager #[phone] Hours Pager: [phone]

## 2017-11-04 NOTE — Progress Notes (Signed)
New Admission Note:   Arrival Method: per stretcher from ED, pt came from home living with daughter Mental Orientation: alert and oriented to person and place, disoriented to time and situation, pt can be intermittently disoriented X4 Telemetry: none ordered Assessment: Completed Skin: warm, with MASD noted under breasts, with redness, MASD, rashes and cracking noted on abdominal folds, bilateral groin, perineum (yeast-induced panniculitis per ED admitting MD), nystatin powder applied, redness noted on the sacrum, prophylactic sacral dressing applied, rash on the right lower leg. IV: G20 on the left forearm with transparent dressing, intact Pain: 10/10 scale on the lower back. Will administer ordered PRN pain medicine as soon as verified by pharmacist. Safety Measures: Safety Fall Prevention has been given, pt needs reinforcement Family: daughter Nira Conn at bedside  Orders have been reviewed and implemented. Fall risk armband applied, yellow socks on, 3 side rails up, call light has been placed within reach and bed alarm activated. Will continue to monitor patient.   Georgeanna Harrison, RN

## 2017-11-04 NOTE — H&P (Signed)
[facility] [place] NAME: Heidi Hanna    MR#:  [id number]  DATE OF BIRTH:  05-Sep-1933  DATE OF ADMISSION:  11/03/2017  PRIMARY CARE PHYSICIAN: System, Pcp Not In   REQUESTING/REFERRING PHYSICIAN: Cinda Quest, MD  CHIEF COMPLAINT:   Chief Complaint  Patient presents with  . Altered Mental Status  . Back Pain    HISTORY OF PRESENT ILLNESS:  Heidi Hanna  is a 84 y.o. female who presents with confusion, decreased appetite, complaint of back pain, panniculitis.  Symptoms have been progressive over the last couple of days according to her daughter.  Patient is unable to contribute much information to her HPI due to her confusion.  On evaluation here in the ED she is found to have a UTI.  She is also found to have likely candidal-induced panniculitis.  Hospitalist were called for admission  PAST MEDICAL HISTORY:   Past Medical History:  Diagnosis Date  . Asthma   . Cancer ([place])   . Collagen vascular disease ([place])   . Diabetes mellitus without complication ([place])   . GERD (gastroesophageal reflux disease)   . HLD (hyperlipidemia)   . Hypertension     PAST SURGICAL HISTORY:   Past Surgical History:  Procedure Laterality Date  . CHOLECYSTECTOMY      SOCIAL HISTORY:   Social History   Tobacco Use  . Smoking status: Never Smoker  . Smokeless tobacco: Never Used  Substance Use Topics  . Alcohol use: No    Frequency: Never    FAMILY HISTORY:   Family History  Problem Relation Age of Onset  . Diabetes Mother   . Heart failure Mother   . Blindness Father     DRUG ALLERGIES:   Allergies  Allergen Reactions  . Shellfish Allergy Nausea And Vomiting  . Penicillins Rash and Other (See Comments)    Has patient had a PCN reaction causing immediate rash, facial/tongue/throat swelling, SOB or lightheadedness with hypotension: No Has patient had a PCN reaction causing severe rash involving mucus membranes or skin necrosis:  No Has patient had a PCN reaction that required hospitalization: No Has patient had a PCN reaction occurring within the last 10 years: No If all of the above answers are "NO", then may proceed with Cephalosporin use.     MEDICATIONS AT HOME:   Prior to Admission medications   Medication Sig Start Date End Date Taking? Authorizing Provider  acetaminophen (TYLENOL) 650 MG CR tablet Take 1,300 mg by mouth 2 (two) times daily.   Yes [provider]  amLODipine (NORVASC) 10 MG tablet Take 10 mg by mouth daily. 10/22/17  Yes [provider]  Ascorbic Acid 1000 MG TBCR Take 1,000 mg by mouth daily.   Yes [provider]  Cholecalciferol (VITAMIN D-3) 5000 units TABS Take 5,000 Units by mouth every 3 (three) days.   Yes [provider]  gabapentin (NEURONTIN) 100 MG capsule Take 100 mg by mouth 2 (two) times daily. 09/12/17  Yes [provider]  guaiFENesin (ROBITUSSIN) 100 MG/5ML liquid Take 200 mg by mouth 3 (three) times daily as needed for cough.   Yes [provider]  HUMALOG KWIKPEN 100 UNIT/ML KiwkPen Inject 5 Units into the skin 3 (three) times daily with meals. 10/25/17  Yes [provider]  hydrALAZINE (APRESOLINE) 25 MG tablet Take 25 mg by mouth 2 (two) times daily. 10/27/17  Yes [provider]  LANTUS SOLOSTAR 100 UNIT/ML Solostar Pen Inject 20  Units into the skin 2 (two) times daily.  10/25/17  Yes [provider]  LORazepam (ATIVAN) 0.5 MG tablet Take 0.5-1 mg by mouth 2 (two) times daily. 0.5 mg in the morning and 1 mg at bedtime 10/07/17  Yes [provider]  losartan (COZAAR) 100 MG tablet Take 100 mg by mouth daily. 09/16/17  Yes [provider]  metoprolol (TOPROL-XL) 200 MG 24 hr tablet Take 200 mg by mouth daily. 10/08/17  Yes [provider]  omeprazole (PRILOSEC) 20 MG capsule Take 20 mg by mouth daily. 08/15/17  Yes [provider]  pravastatin (PRAVACHOL) 80 MG  tablet Take 80 mg by mouth daily. 08/05/17  Yes [provider]    REVIEW OF SYSTEMS:  Review of Systems  Unable to perform ROS: Dementia     VITAL SIGNS:   Vitals:   11/03/17 1638  BP: 134/60  Pulse: 61  Temp: 97.6 F (36.4 C)  TempSrc: Oral  SpO2: 96%  Height: 5\' 3"  (1.6 m)   Wt Readings from Last 3 Encounters:  No data found for Wt    PHYSICAL EXAMINATION:  Physical Exam  Vitals reviewed. Constitutional: She appears well-developed and well-nourished. No distress.  HENT:  Head: Normocephalic and atraumatic.  Mouth/Throat: Oropharynx is clear and moist.  Eyes: Conjunctivae and EOM are normal. Pupils are equal, round, and reactive to light. No scleral icterus.  Neck: Normal range of motion. Neck supple. No JVD present. No thyromegaly present.  Cardiovascular: Normal rate, regular rhythm and intact distal pulses. Exam reveals no gallop and no friction rub.  No murmur heard. Respiratory: Effort normal and breath sounds normal. No respiratory distress. She has no wheezes. She has no rales.  GI: Soft. Bowel sounds are normal. She exhibits no distension. There is no tenderness.  Musculoskeletal: Normal range of motion. She exhibits tenderness (Right flank). She exhibits no edema.  No arthritis, no gout  Lymphadenopathy:    She has no cervical adenopathy.  Neurological: She is alert. No cranial nerve deficit.  Unable to fully assess due to patient condition  Skin: Skin is warm and dry. No rash noted. There is erythema (Candidal appearing panniculitis).  Psychiatric:  Unable to assess due to patient condition    LABORATORY PANEL:   CBC Recent Labs  Lab 11/03/17 1649  WBC 12.0*  HGB 12.1  HCT 36.7  PLT 300   ------------------------------------------------------------------------------------------------------------------  Chemistries  Recent Labs  Lab 11/03/17 1649  NA 143  K 4.2  CL 107  CO2 26  GLUCOSE 124*  BUN 21*  CREATININE 0.78  CALCIUM  9.2  AST 22  ALT 11*  ALKPHOS 57  BILITOT 0.6   ------------------------------------------------------------------------------------------------------------------  Cardiac Enzymes Recent Labs  Lab 11/03/17 1649  TROPONINI 0.09*   ------------------------------------------------------------------------------------------------------------------  RADIOLOGY:  Ct Head Wo Contrast  Result Date: 11/03/2017 CLINICAL DATA:  Confusion for 1 week. EXAM: CT HEAD WITHOUT CONTRAST TECHNIQUE: Contiguous axial images were obtained from the base of the skull through the vertex without intravenous contrast. COMPARISON:  CT HEAD December 28, 2011 FINDINGS: Moderately motion degraded examination, mildly improved on repeat imaging. BRAIN: No intraparenchymal hemorrhage, mass effect nor midline shift. The ventricles and sulci are normal for age. Patchy supratentorial white matter hypodensities most compatible with chronic small vessel ischemic disease. No acute large vascular territory infarcts. No abnormal extra-axial fluid collections. Basal cisterns are patent. VASCULAR: Moderate to severe calcific atherosclerosis of the carotid siphons. SKULL: No skull fracture. No significant scalp soft tissue swelling. SINUSES/ORBITS: The  mastoid air-cells and included paranasal sinuses are well-aerated.The included ocular globes and orbital contents are non-suspicious. OTHER: Poor dentition. IMPRESSION: 1. No acute intracranial process on this moderately motion degraded examination. Electronically Signed   By: Elon Alas M.D.   On: 11/03/2017 23:18   Dg Chest Portable 1 View  Result Date: 11/03/2017 CLINICAL DATA:  Low back pain and shortness of breath. EXAM: PORTABLE CHEST 1 VIEW COMPARISON:  07/22/2011; 07/20/2011 FINDINGS: Grossly unchanged cardiac silhouette and mediastinal contours with atherosclerotic plaque within the thoracic aorta. Veiling opacities overlying the bilateral lower lungs are favored to represent  overlying breast tissues. No focal airspace opacities. No pleural effusion or pneumothorax. No evidence of edema. No acute osseus abnormalities. IMPRESSION: No acute cardiopulmonary disease on this AP portable examination. Electronically Signed   By: Sandi Mariscal M.D.   On: 11/03/2017 20:43    EKG:   Orders placed or performed during the hospital encounter of 11/03/17  . ED EKG  . ED EKG  . EKG 12-Lead  . EKG 12-Lead    IMPRESSION AND PLAN:  Principal Problem:   UTI (urinary tract infection) -with suspected pyelonephritis, IV antibiotics in place, urine culture sent, CT abdomen and pelvis pending to evaluate for possible pyelonephritis Active Problems:   Candida-induced panniculitis -p.o. Diflucan, topical nystatin powder   Diabetes ([place]) -sliding scale insulin with corresponding glucose checks   HTN (hypertension) -continue home meds   HLD (hyperlipidemia) -home dose statin   GERD (gastroesophageal reflux disease) -home dose PPI  All the records are reviewed and case discussed with ED provider. Management plans discussed with the patient and/or family.  DVT PROPHYLAXIS: SubQ lovenox  GI PROPHYLAXIS: PPI  ADMISSION STATUS: Inpatient  CODE STATUS: Full Code Status History    This patient does not have a recorded code status. Please follow your organizational policy for patients in this situation.      TOTAL TIME TAKING CARE OF THIS PATIENT: 45 minutes.   Elleanor Guyett Zwingle 11/04/2017, 1:11 AM  [employer]  [phone]  CC: Primary care physician; System, Pcp Not In  Note:  This document was prepared using Dragon voice recognition software and may include unintentional dictation errors.

## 2017-11-04 NOTE — Progress Notes (Signed)
PHARMACY - PHYSICIAN COMMUNICATION CRITICAL VALUE ALERT - BLOOD CULTURE IDENTIFICATION (BCID)  Heidi Hanna is an 84 y.o. female who presented to [facility] on 11/03/2017 with a chief complaint of AMS, back pain  Assessment:  Afebrile, tachypneic, WBC 12.0 >> 12.5, CXR NG, CT head NG (include suspected source if known)  Name of physician (or Provider) Contacted: Lance Coon  Current antibiotics: Ceftriaxone 1g IV daily  Changes to prescribed antibiotics recommended:  Recommendations taken by provider -- recommended to switch from ceftriaxone to cefazolin 2g IV q8h for possible MSSA bacteremia.  Results for orders placed or performed during the hospital encounter of 11/03/17  Blood Culture ID Panel (Reflexed) (Collected: 11/03/2017 10:55 PM)  Result Value Ref Range   Enterococcus species NOT DETECTED NOT DETECTED   Listeria monocytogenes NOT DETECTED NOT DETECTED   Staphylococcus species DETECTED (A) NOT DETECTED   Staphylococcus aureus NOT DETECTED NOT DETECTED   Methicillin resistance NOT DETECTED NOT DETECTED   Streptococcus species NOT DETECTED NOT DETECTED   Streptococcus agalactiae NOT DETECTED NOT DETECTED   Streptococcus pneumoniae NOT DETECTED NOT DETECTED   Streptococcus pyogenes NOT DETECTED NOT DETECTED   Acinetobacter baumannii NOT DETECTED NOT DETECTED   Enterobacteriaceae species NOT DETECTED NOT DETECTED   Enterobacter cloacae complex NOT DETECTED NOT DETECTED   Escherichia coli NOT DETECTED NOT DETECTED   Klebsiella oxytoca NOT DETECTED NOT DETECTED   Klebsiella pneumoniae NOT DETECTED NOT DETECTED   Proteus species NOT DETECTED NOT DETECTED   Serratia marcescens NOT DETECTED NOT DETECTED   Haemophilus influenzae NOT DETECTED NOT DETECTED   Neisseria meningitidis NOT DETECTED NOT DETECTED   Pseudomonas aeruginosa NOT DETECTED NOT DETECTED   Candida albicans NOT DETECTED NOT DETECTED   Candida glabrata NOT DETECTED NOT DETECTED   Candida krusei NOT DETECTED NOT  DETECTED   Candida parapsilosis NOT DETECTED NOT DETECTED   Candida tropicalis NOT DETECTED NOT DETECTED   Tobie Lords, PharmD, BCPS Clinical Pharmacist 11/04/2017

## 2017-11-04 NOTE — Progress Notes (Signed)
[place] [facility] [place] NAME: Heidi Hanna    MR#:  [id number]  DATE OF BIRTH:  [birth year]  SUBJECTIVE:  CHIEF COMPLAINT:   Chief Complaint  Patient presents with  . Altered Mental Status  . Back Pain  Patient is confused and disoriented, baseline is unknown, no family available at this time, patient evaluated with nurse present in the room  REVIEW OF SYSTEMS:  CONSTITUTIONAL:  Unable to be obtained given confusion/disorientation  ROS  DRUG ALLERGIES:   Allergies  Allergen Reactions  . Shellfish Allergy Nausea And Vomiting  . Penicillins Rash and Other (See Comments)    Has patient had a PCN reaction causing immediate rash, facial/tongue/throat swelling, SOB or lightheadedness with hypotension: No Has patient had a PCN reaction causing severe rash involving mucus membranes or skin necrosis: No Has patient had a PCN reaction that required hospitalization: No Has patient had a PCN reaction occurring within the last 10 years: No If all of the above answers are "NO", then may proceed with Cephalosporin use.     VITALS:  Blood pressure (!) 135/47, pulse 91, temperature 99 F (37.2 C), temperature source Oral, resp. rate 20, height 5\' 3"  (1.6 m), weight 104.3 kg (230 lb), SpO2 92 %.  PHYSICAL EXAMINATION:  GENERAL:  84 y.o.-year-old patient lying in the bed with no acute distress.  EYES: Pupils equal, round, reactive to light and accommodation. No scleral icterus. Extraocular muscles intact.  Morbidly obese, nontoxic-appearing HEENT: Head atraumatic, normocephalic. Oropharynx and nasopharynx clear.  NECK:  Supple, no jugular venous distention. No thyroid enlargement, no tenderness.  LUNGS: Normal breath sounds bilaterally, no wheezing, rales,rhonchi or crepitation. No use of accessory muscles of respiration.  CARDIOVASCULAR: S1, S2 normal. No murmurs, rubs, or gallops.  ABDOMEN: Soft, nontender, nondistended. Bowel sounds present. No  organomegaly or mass.  EXTREMITIES: No pedal edema, cyanosis, or clubbing.  NEUROLOGIC: Cranial nerves II through XII are intact. MAES. Gait not checked.  PSYCHIATRIC: The patient is confused and disoriented.  SKIN: Extensive panniculitis/bilateral groin/upper thigh skin candidiasis    Physical Exam LABORATORY PANEL:   CBC Recent Labs  Lab 11/04/17 0332  WBC 12.5*  HGB 11.5*  HCT 34.5*  PLT 306   ------------------------------------------------------------------------------------------------------------------  Chemistries  Recent Labs  Lab 11/03/17 1649 11/04/17 0332  NA 143 139  K 4.2 3.3*  CL 107 103  CO2 26 27  GLUCOSE 124* 72  BUN 21* 16  CREATININE 0.78 0.68  CALCIUM 9.2 8.5*  AST 22  --   ALT 11*  --   ALKPHOS 57  --   BILITOT 0.6  --    ------------------------------------------------------------------------------------------------------------------  Cardiac Enzymes Recent Labs  Lab 11/03/17 1649  TROPONINI 0.09*   ------------------------------------------------------------------------------------------------------------------  RADIOLOGY:  Ct Head Wo Contrast  Result Date: 11/03/2017 CLINICAL DATA:  Confusion for 1 week. EXAM: CT HEAD WITHOUT CONTRAST TECHNIQUE: Contiguous axial images were obtained from the base of the skull through the vertex without intravenous contrast. COMPARISON:  CT HEAD December 28, 2011 FINDINGS: Moderately motion degraded examination, mildly improved on repeat imaging. BRAIN: No intraparenchymal hemorrhage, mass effect nor midline shift. The ventricles and sulci are normal for age. Patchy supratentorial white matter hypodensities most compatible with chronic small vessel ischemic disease. No acute large vascular territory infarcts. No abnormal extra-axial fluid collections. Basal cisterns are patent. VASCULAR: Moderate to severe calcific atherosclerosis of the carotid siphons. SKULL: No skull fracture. No significant scalp soft tissue  swelling. SINUSES/ORBITS: The mastoid air-cells and  included paranasal sinuses are well-aerated.The included ocular globes and orbital contents are non-suspicious. OTHER: Poor dentition. IMPRESSION: 1. No acute intracranial process on this moderately motion degraded examination. Electronically Signed   By: Elon Alas M.D.   On: 11/03/2017 23:18   Dg Chest Portable 1 View  Result Date: 11/03/2017 CLINICAL DATA:  Low back pain and shortness of breath. EXAM: PORTABLE CHEST 1 VIEW COMPARISON:  07/22/2011; 07/20/2011 FINDINGS: Grossly unchanged cardiac silhouette and mediastinal contours with atherosclerotic plaque within the thoracic aorta. Veiling opacities overlying the bilateral lower lungs are favored to represent overlying breast tissues. No focal airspace opacities. No pleural effusion or pneumothorax. No evidence of edema. No acute osseus abnormalities. IMPRESSION: No acute cardiopulmonary disease on this AP portable examination. Electronically Signed   By: Sandi Mariscal M.D.   On: 11/03/2017 20:43    ASSESSMENT AND PLAN:  1 acute probable urinary tract infection  Continue empiric Rocephin, follow-up on cultures   2 acute on chronic extensive skin candidiasis/panniculitis  Stable continue Diflucan and topical nystatin powder  3 chronic diabetes mellitus type 2 Stable on current regimen  4 chronic benign essential hypertension Stable Continue home meds  5 chronic hyperlipidemia, unspecified Stable on statin therapy  6 chronic GERD without esophagitis PPI daily  7 acute confusion Presented with altered mental status ?  At baseline  All the records are reviewed and case discussed with Care Management/Social Workerr. Management plans discussed with the patient, family and they are in agreement.  CODE STATUS: full  TOTAL TIME TAKING CARE OF THIS PATIENT: 40 minutes.   POSSIBLE D/C IN 2-3 DAYS, DEPENDING ON CLINICAL CONDITION.   Avel Peace Salary M.D on 11/04/2017   Between  7am to 6pm - Pager - [phone]  After 6pm go to [email or web address] - password [email or web address] [facility]  Office  [phone]  CC: Primary care physician; System, Pcp Not In  Note: This dictation was prepared with Dragon dictation along with smaller phrase technology. Any transcriptional errors that result from this process are unintentional.

## 2017-11-04 NOTE — Progress Notes (Signed)
lovenox dose increased to 40mg  BID due to BMI >40 per protocol  Ramond Dial, Pharm.D, BCPS Clinical Pharmacist

## 2017-11-05 LAB — GLUCOSE, CAPILLARY
Glucose-Capillary: 159 mg/dL — ABNORMAL HIGH (ref 65–99)
Glucose-Capillary: 163 mg/dL — ABNORMAL HIGH (ref 65–99)
Glucose-Capillary: 175 mg/dL — ABNORMAL HIGH (ref 65–99)
Glucose-Capillary: 161 mg/dL — ABNORMAL HIGH (ref 65–99)

## 2017-11-05 LAB — URINE CULTURE

## 2017-11-05 MED ORDER — NYSTATIN 100000 UNIT/GM EX POWD
Freq: Three times a day (TID) | CUTANEOUS | Status: DC
  Administered 2017-11-05 – 2017-11-10 (×14): via TOPICAL
  Filled 2017-11-05: qty 15

## 2017-11-05 MED ORDER — NYSTATIN 100000 UNIT/GM EX OINT
TOPICAL_OINTMENT | Freq: Two times a day (BID) | CUTANEOUS | Status: DC
  Administered 2017-11-05: 14:00:00 via TOPICAL
  Filled 2017-11-05: qty 15

## 2017-11-05 MED ORDER — FLUCONAZOLE IN SODIUM CHLORIDE 200-0.9 MG/100ML-% IV SOLN
200.0000 mg | Freq: Once | INTRAVENOUS | Status: AC
  Administered 2017-11-05: 200 mg via INTRAVENOUS
  Filled 2017-11-05: qty 100

## 2017-11-05 MED ORDER — FLUCONAZOLE 100MG IVPB
100.0000 mg | INTRAVENOUS | Status: AC
  Administered 2017-11-06 – 2017-11-09 (×4): 100 mg via INTRAVENOUS
  Filled 2017-11-05 (×5): qty 50

## 2017-11-05 NOTE — Progress Notes (Signed)
[place] at [place] NAME: Heidi Hanna    MR#:  [id number]  DATE OF BIRTH:  [birth year]  SUBJECTIVE:  CHIEF COMPLAINT:   Chief Complaint  Patient presents with  . Altered Mental Status  . Back Pain  Patient continues to be confused, disoriented, no events overnight per nursing staff, 2 out of 2 bottles blood cultures noted for coag negative staph-repeat blood cultures ordered, urine culture noted for contaminant-repeat urine culture ordered, for echocardiogram  REVIEW OF SYSTEMS:  CONSTITUTIONAL: No fever, fatigue or weakness.  EYES: No blurred or double vision.  EARS, NOSE, AND THROAT: No tinnitus or ear pain.  RESPIRATORY: No cough, shortness of breath, wheezing or hemoptysis.  CARDIOVASCULAR: No chest pain, orthopnea, edema.  GASTROINTESTINAL: No nausea, vomiting, diarrhea or abdominal pain.  GENITOURINARY: No dysuria, hematuria.  ENDOCRINE: No polyuria, nocturia,  HEMATOLOGY: No anemia, easy bruising or bleeding SKIN: No rash or lesion. MUSCULOSKELETAL: No joint pain or arthritis.   NEUROLOGIC: No tingling, numbness, weakness.  PSYCHIATRY: No anxiety or depression.   ROS  DRUG ALLERGIES:   Allergies  Allergen Reactions  . Shellfish Allergy Nausea And Vomiting  . Penicillins Rash and Other (See Comments)    Has patient had a PCN reaction causing immediate rash, facial/tongue/throat swelling, SOB or lightheadedness with hypotension: No Has patient had a PCN reaction causing severe rash involving mucus membranes or skin necrosis: No Has patient had a PCN reaction that required hospitalization: No Has patient had a PCN reaction occurring within the last 10 years: No If all of the above answers are "NO", then may proceed with Cephalosporin use.     VITALS:  Blood pressure (!) 146/47, pulse 72, temperature 99 F (37.2 C), temperature source Oral, resp. rate 18, height 5\' 3"  (1.6 m), weight 104.3 kg (230 lb), SpO2 95 %.  PHYSICAL  EXAMINATION:  GENERAL:  84 y.o.-year-old patient lying in the bed with no acute distress.  EYES: Pupils equal, round, reactive to light and accommodation. No scleral icterus. Extraocular muscles intact.  HEENT: Head atraumatic, normocephalic. Oropharynx and nasopharynx clear.  NECK:  Supple, no jugular venous distention. No thyroid enlargement, no tenderness.  LUNGS: Normal breath sounds bilaterally, no wheezing, rales,rhonchi or crepitation. No use of accessory muscles of respiration.  CARDIOVASCULAR: S1, S2 normal. No murmurs, rubs, or gallops.  ABDOMEN: Soft, nontender, nondistended. Bowel sounds present. No organomegaly or mass.  EXTREMITIES: No pedal edema, cyanosis, or clubbing.  NEUROLOGIC: Cranial nerves II through XII are intact. MAES. Gait not checked.  PSYCHIATRIC: The patient is alert, awake, oriented x1, confused/disoriented  SKIN: Extensive skin candidiasis involving upper thigh/perineum/and large panniculus    Physical Exam LABORATORY PANEL:   CBC Recent Labs  Lab 11/04/17 0332  WBC 12.5*  HGB 11.5*  HCT 34.5*  PLT 306   ------------------------------------------------------------------------------------------------------------------  Chemistries  Recent Labs  Lab 11/03/17 1649 11/04/17 0332  NA 143 139  K 4.2 3.3*  CL 107 103  CO2 26 27  GLUCOSE 124* 72  BUN 21* 16  CREATININE 0.78 0.68  CALCIUM 9.2 8.5*  AST 22  --   ALT 11*  --   ALKPHOS 57  --   BILITOT 0.6  --    ------------------------------------------------------------------------------------------------------------------  Cardiac Enzymes Recent Labs  Lab 11/03/17 1649  TROPONINI 0.09*   ------------------------------------------------------------------------------------------------------------------  RADIOLOGY:  Ct Head Wo Contrast  Result Date: 11/03/2017 CLINICAL DATA:  Confusion for 1 week. EXAM: CT HEAD WITHOUT CONTRAST TECHNIQUE: Contiguous axial images were obtained  from the  base of the skull through the vertex without intravenous contrast. COMPARISON:  CT HEAD December 28, 2011 FINDINGS: Moderately motion degraded examination, mildly improved on repeat imaging. BRAIN: No intraparenchymal hemorrhage, mass effect nor midline shift. The ventricles and sulci are normal for age. Patchy supratentorial white matter hypodensities most compatible with chronic small vessel ischemic disease. No acute large vascular territory infarcts. No abnormal extra-axial fluid collections. Basal cisterns are patent. VASCULAR: Moderate to severe calcific atherosclerosis of the carotid siphons. SKULL: No skull fracture. No significant scalp soft tissue swelling. SINUSES/ORBITS: The mastoid air-cells and included paranasal sinuses are well-aerated.The included ocular globes and orbital contents are non-suspicious. OTHER: Poor dentition. IMPRESSION: 1. No acute intracranial process on this moderately motion degraded examination. Electronically Signed   By: Elon Alas M.D.   On: 11/03/2017 23:18   Dg Chest Portable 1 View  Result Date: 11/03/2017 CLINICAL DATA:  Low back pain and shortness of breath. EXAM: PORTABLE CHEST 1 VIEW COMPARISON:  07/22/2011; 07/20/2011 FINDINGS: Grossly unchanged cardiac silhouette and mediastinal contours with atherosclerotic plaque within the thoracic aorta. Veiling opacities overlying the bilateral lower lungs are favored to represent overlying breast tissues. No focal airspace opacities. No pleural effusion or pneumothorax. No evidence of edema. No acute osseus abnormalities. IMPRESSION: No acute cardiopulmonary disease on this AP portable examination. Electronically Signed   By: Sandi Mariscal M.D.   On: 11/03/2017 20:43    ASSESSMENT AND PLAN:  1 acute probable urinary tract infection  Resolving Continue empiric Rocephin, urine culture noted for contaminant-repeat urine culture    2 acute on chronic extensive skin candidiasis/panniculitis  Stable continue Diflucan  and topical nystatin ointment   3 chronic diabetes mellitus type 2 Stable on current regimen  4 chronic benign essential hypertension Stable Continue home meds  5 chronic hyperlipidemia, unspecified Stable on statin therapy  6 chronic GERD without esophagitis PPI daily  7 acute confusion Presented with altered mental status ?  At baseline  8 acute abnormal blood cultures 2 out of 2 bottles positive for coag negative staph-repeat blood cultures, check echocardiogram  All the records are reviewed and case discussed with Care Management/Social Workerr. Management plans discussed with the patient, family and they are in agreement.  CODE STATUS: full   TOTAL TIME TAKING CARE OF THIS PATIENT: 35 minutes.     POSSIBLE D/C IN 2-3 DAYS, DEPENDING ON CLINICAL CONDITION.   Avel Peace Salary M.D on 11/05/2017   Between 7am to 6pm - Pager - [phone]  After 6pm go to [email or web address] - password [email or web address] [facility]  Office  [phone]  CC: Primary care physician; System, Pcp Not In  Note: This dictation was prepared with Dragon dictation along with smaller phrase technology. Any transcriptional errors that result from this process are unintentional.

## 2017-11-05 NOTE — NC FL2 (Signed)
[place] LEVEL OF CARE SCREENING TOOL     IDENTIFICATION  Patient Name: Heidi Hanna Birthdate: [birth year] Sex: female Admission Date (Current Location): 11/03/2017  [place] and [place] Number:  Engineering geologist and Address:  [facility], [address] [place], [place] [address]      Provider Number: [id number]  Attending Physician Name and Address:  Gorden Harms, MD  Relative Name and Phone Number:       Current Level of Care: Hospital Recommended Level of Care: [place] Prior Approval Number:    Date Approved/Denied:   PASRR Number: [id number])  Discharge Plan: SNF    Current Diagnoses: Patient Active Problem List   Diagnosis Date Noted  . UTI (urinary tract infection) 11/04/2017  . Diabetes ([place]) 11/04/2017  . HTN (hypertension) 11/04/2017  . HLD (hyperlipidemia) 11/04/2017  . GERD (gastroesophageal reflux disease) 11/04/2017  . Candida-induced panniculitis 11/04/2017    Orientation RESPIRATION BLADDER Height & Weight     Self  Normal Incontinent Weight: 230 lb (104.3 kg) Height:  5\' 3"  (160 cm)  BEHAVIORAL SYMPTOMS/MOOD NEUROLOGICAL BOWEL NUTRITION STATUS      Continent Diet(Diet: DYS 3 )  AMBULATORY STATUS COMMUNICATION OF NEEDS Skin   Extensive Assist Verbally Normal                       Personal Care Assistance Level of Assistance  Bathing, Feeding, Dressing Bathing Assistance: Limited assistance Feeding assistance: Independent Dressing Assistance: Limited assistance     Functional Limitations Info  Sight, Hearing, Speech Sight Info: Adequate Hearing Info: Impaired Speech Info: Adequate    SPECIAL CARE FACTORS FREQUENCY  PT (By licensed PT), OT (By licensed OT)     PT Frequency: (5) OT Frequency: (5)            Contractures      Additional Factors Info  Code Status, Allergies Code Status Info: (Full Code. ) Allergies Info: (Shellfish Allergy, Penicillins)            Current Medications (11/05/2017):  This is the current hospital active medication list Current Facility-Administered Medications  Medication Dose Route Frequency Provider Last Rate Last Dose  . acetaminophen (TYLENOL) tablet 650 mg  650 mg Oral Q6H PRN Lance Coon, MD   650 mg at 11/05/17 9381   Or  . acetaminophen (TYLENOL) suppository 650 mg  650 mg Rectal Q6H PRN Lance Coon, MD      . amLODipine (NORVASC) tablet 10 mg  10 mg Oral Daily Lance Coon, MD   10 mg at 11/05/17 1054  . ceFAZolin (ANCEF) IVPB 2g/100 mL premix  2 g Intravenous Driscilla Moats, MD   Stopped at 11/05/17 1330  . enoxaparin (LOVENOX) injection 40 mg  40 mg Subcutaneous Q12H Marcelle Overlie D, RPH   40 mg at 11/05/17 1055  . fluconazole (DIFLUCAN) IVPB 200 mg  200 mg Intravenous Once Salary, Montell D, MD 100 mL/hr at 11/05/17 1420 200 mg at 11/05/17 1420   Followed by  . [START ON 11/06/2017] fluconazole (DIFLUCAN) IVPB 100 mg  100 mg Intravenous Q24H Salary, Montell D, MD      . gabapentin (NEURONTIN) capsule 100 mg  100 mg Oral BID Lance Coon, MD   100 mg at 11/05/17 0175  . hydrALAZINE (APRESOLINE) tablet 25 mg  25 mg Oral BID Lance Coon, MD   25 mg at 11/05/17 1054  . Influenza vac split quadrivalent PF (FLUZONE HIGH-DOSE) injection 0.5 mL  0.5 mL Intramuscular Tomorrow-1000 Lance Coon, MD      . insulin aspart (novoLOG) injection 0-5 Units  0-5 Units Subcutaneous QHS Lance Coon, MD      . insulin aspart (novoLOG) injection 0-9 Units  0-9 Units Subcutaneous TID Kenn File, MD   2 Units at 11/05/17 1300  . insulin aspart (novoLOG) injection 5 Units  5 Units Subcutaneous TID WC Salary, Montell D, MD      . insulin glargine (LANTUS) injection 10 Units  10 Units Subcutaneous BID Lance Coon, MD   10 Units at 11/05/17 1054  . losartan (COZAAR) tablet 100 mg  100 mg Oral Daily Lance Coon, MD   100 mg at 11/05/17 1053  . metoprolol succinate (TOPROL-XL) 24 hr tablet 200 mg  200 mg  Oral Daily Lance Coon, MD   200 mg at 11/05/17 1053  . morphine 4 MG/ML injection 4 mg  4 mg Intravenous Q4H PRN Lance Coon, MD      . multivitamin with minerals tablet 1 tablet  1 tablet Oral Daily Salary, Montell D, MD   1 tablet at 11/05/17 1053  . nystatin ointment (MYCOSTATIN)   Topical BID Salary, Montell D, MD      . ondansetron (ZOFRAN) tablet 4 mg  4 mg Oral Q6H PRN Lance Coon, MD       Or  . ondansetron [facility]) injection 4 mg  4 mg Intravenous Q6H PRN Lance Coon, MD      . oxyCODONE (Oxy IR/ROXICODONE) immediate release tablet 5 mg  5 mg Oral Q4H PRN Lance Coon, MD   5 mg at 11/04/17 2012  . pantoprazole (PROTONIX) EC tablet 40 mg  40 mg Oral Daily Lance Coon, MD   40 mg at 11/05/17 1054  . pravastatin (PRAVACHOL) tablet 80 mg  80 mg Oral Daily Lance Coon, MD   80 mg at 11/04/17 1742  . protein supplement (PREMIER PROTEIN) liquid  11 oz Oral BID BM Salary, Montell D, MD   11 oz at 11/05/17 1420     Discharge Medications: Please see discharge summary for a list of discharge medications.  Relevant Imaging Results:  Relevant Lab Results:   Additional Information (SSN: [id number])  Naina Sleeper, Veronia Beets, LCSW

## 2017-11-06 ENCOUNTER — Inpatient Hospital Stay (HOSPITAL_COMMUNITY)
Admit: 2017-11-06 | Discharge: 2017-11-06 | Disposition: A | Discharge status: Home / Self Care | Payer: Medicare Other | Attending: Internal Medicine | Admitting: Internal Medicine

## 2017-11-06 DIAGNOSIS — I5030 Unspecified diastolic (congestive) heart failure: Secondary | ICD-10-CM

## 2017-11-06 LAB — GLUCOSE, CAPILLARY
GLUCOSE-CAPILLARY: 129 mg/dL — AB (ref 65–99)
Glucose-Capillary: 266 mg/dL — ABNORMAL HIGH (ref 65–99)
Glucose-Capillary: 249 mg/dL — ABNORMAL HIGH (ref 65–99)
Glucose-Capillary: 210 mg/dL — ABNORMAL HIGH (ref 65–99)

## 2017-11-06 LAB — ECHOCARDIOGRAM COMPLETE
Height: 63 in
Weight: 3680 oz

## 2017-11-06 MED ORDER — SENNOSIDES-DOCUSATE SODIUM 8.6-50 MG PO TABS
2.0000 | ORAL_TABLET | Freq: Every day | ORAL | Status: DC
  Administered 2017-11-06 – 2017-11-09 (×4): 2 via ORAL
  Filled 2017-11-06 (×4): qty 2

## 2017-11-06 MED ORDER — DIPHENHYDRAMINE HCL 25 MG PO CAPS
25.0000 mg | ORAL_CAPSULE | Freq: Once | ORAL | Status: AC
  Administered 2017-11-06: 25 mg via ORAL
  Filled 2017-11-06: qty 1

## 2017-11-06 MED ORDER — MEGESTROL ACETATE 400 MG/10ML PO SUSP
400.0000 mg | Freq: Two times a day (BID) | ORAL | Status: DC
  Administered 2017-11-06 – 2017-11-10 (×9): 400 mg via ORAL
  Filled 2017-11-06 (×10): qty 10

## 2017-11-06 NOTE — Progress Notes (Signed)
[place] at [place] NAME: Heidi Hanna    MR#:  [id number]  DATE OF BIRTH:  01-20-1933  SUBJECTIVE:  CHIEF COMPLAINT:   Chief Complaint  Patient presents with  . Altered Mental Status  . Back Pain  Patient without complaint, no events overnight per nursing staff, dietary to see, poor appetite per daughter-start megace bid, follow-up on outstanding cultures  REVIEW OF SYSTEMS:  CONSTITUTIONAL: No fever, fatigue or weakness.  EYES: No blurred or double vision.  EARS, NOSE, AND THROAT: No tinnitus or ear pain.  RESPIRATORY: No cough, shortness of breath, wheezing or hemoptysis.  CARDIOVASCULAR: No chest pain, orthopnea, edema.  GASTROINTESTINAL: No nausea, vomiting, diarrhea or abdominal pain.  GENITOURINARY: No dysuria, hematuria.  ENDOCRINE: No polyuria, nocturia,  HEMATOLOGY: No anemia, easy bruising or bleeding SKIN: No rash or lesion. MUSCULOSKELETAL: No joint pain or arthritis.   NEUROLOGIC: No tingling, numbness, weakness.  PSYCHIATRY: No anxiety or depression.   ROS  DRUG ALLERGIES:   Allergies  Allergen Reactions  . Shellfish Allergy Nausea And Vomiting  . Penicillins Rash and Other (See Comments)    Has patient had a PCN reaction causing immediate rash, facial/tongue/throat swelling, SOB or lightheadedness with hypotension: No Has patient had a PCN reaction causing severe rash involving mucus membranes or skin necrosis: No Has patient had a PCN reaction that required hospitalization: No Has patient had a PCN reaction occurring within the last 10 years: No If all of the above answers are "NO", then may proceed with Cephalosporin use.     VITALS:  Blood pressure (!) 164/54, pulse 68, temperature 99 F (37.2 C), temperature source Oral, resp. rate 16, height 5\' 3"  (1.6 m), weight 104.3 kg (230 lb), SpO2 94 %.  PHYSICAL EXAMINATION:  GENERAL:  84 y.o.-year-old patient lying in the bed with no acute distress.  Obese EYES:  Pupils equal, round, reactive to light and accommodation. No scleral icterus. Extraocular muscles intact.  HEENT: Head atraumatic, normocephalic. Oropharynx and nasopharynx clear.  NECK:  Supple, no jugular venous distention. No thyroid enlargement, no tenderness.  LUNGS: Normal breath sounds bilaterally, no wheezing, rales,rhonchi or crepitation. No use of accessory muscles of respiration.  CARDIOVASCULAR: S1, S2 normal. No murmurs, rubs, or gallops.  ABDOMEN: Soft, nontender, nondistended. Bowel sounds present. No organomegaly or mass.  EXTREMITIES: No pedal edema, cyanosis, or clubbing.  NEUROLOGIC: Cranial nerves II through XII are intact. MAES. Gait not checked.  PSYCHIATRIC: The patient is alert and oriented x 3.  SKIN: Slightly improved extensive skin candidiasis    Physical Exam LABORATORY PANEL:   CBC Recent Labs  Lab 11/04/17 0332  WBC 12.5*  HGB 11.5*  HCT 34.5*  PLT 306   ------------------------------------------------------------------------------------------------------------------  Chemistries  Recent Labs  Lab 11/03/17 1649 11/04/17 0332  NA 143 139  K 4.2 3.3*  CL 107 103  CO2 26 27  GLUCOSE 124* 72  BUN 21* 16  CREATININE 0.78 0.68  CALCIUM 9.2 8.5*  AST 22  --   ALT 11*  --   ALKPHOS 57  --   BILITOT 0.6  --    ------------------------------------------------------------------------------------------------------------------  Cardiac Enzymes Recent Labs  Lab 11/03/17 1649  TROPONINI 0.09*   ------------------------------------------------------------------------------------------------------------------  RADIOLOGY:  No results found.  ASSESSMENT AND PLAN:  1acute probable urinary tract infection  Resolving Continue empiric Rocephin, urine culture noted for contaminant-follow-up on repeat urine culture   2acute on chronic extensive skin candidiasis/panniculitis  Resolving ContinueDiflucanandtopical nystatin   3chronic  diabetes mellitus type 2 Stable on current regimen  4chronic benign essential hypertension Stable Continuehome meds  5chronic hyperlipidemia, unspecified Stable on statin therapy  6chronic GERD without esophagitis PPI daily  7acute confusion Resolved Presented with altered mental status Appears to be at neurological baseline, noted history of CVA with memory deficits chronically/mild dementia Daughter to come in and evaluate patient  8 acute abnormal blood cultures 2 out of 2 bottles positive for coag negative staph-repeat blood cultures negative, follow-up on echocardiogram  All the records are reviewed and case discussed with Care Management/Social Workerr. Management plans discussed with the patient, family and they are in agreement.  CODE STATUS: full  TOTAL TIME TAKING CARE OF THIS PATIENT: 35 minutes.   POSSIBLE D/C IN 1-3 DAYS, DEPENDING ON CLINICAL CONDITION.   Avel Peace Willma Obando M.D on 11/06/2017   Between 7am to 6pm - Pager - [phone]  After 6pm go to [email or web address] - password [email or web address] [facility]  Office  [phone]  CC: Primary care physician; System, Pcp Not In  Note: This dictation was prepared with Dragon dictation along with smaller phrase technology. Any transcriptional errors that result from this process are unintentional.

## 2017-11-06 NOTE — Clinical Social Work Note (Addendum)
Clinical Social Work Assessment  Patient Details  Name: Heidi Hanna MRN: [id number] Date of Birth: May 07, 1933  Date of referral:  11/06/17               Reason for consult:  Facility Placement, Discharge Planning                Permission sought to share information with:    Permission granted to share information::     Name::      [place]::   [place]  Relationship::     Contact Information:     Housing/Transportation Living arrangements for the past 2 months:  Single Family Home Source of Information:  Adult Children Patient Interpreter Needed:  None Criminal Activity/Legal Involvement Pertinent to Current Situation/Hospitalization:  No - Comment as needed Significant Relationships:  Adult Children Lives with:  Adult Children Do you feel safe going back to the place where you live?  Yes Need for family participation in patient care:  Yes (Comment)  Care giving concerns: Patient lives in [place] with Roslyn [phone]).   Social Worker assessment / plan: Holiday representative (CSW) reviewed chart and noted that PT is recommending SNF. Social work Theatre manager met with patient and her daughter Nira Conn at bedside. Patient was sitting up in bed and alert only to self. Social work Theatre manager introduced self and explained the role of the [place]. Patient's daughter shared that patient lives with her in [place] and she is patient's HPOA. Social work Theatre manager explained that PT is recommending SNF and the process. Patient's daughter verbalized her understanding and prefers to take patient home. Patient has previously been placed at [facility]) and refused to participate in PT. RN case manager aware of above. CSW and social work Theatre manager will continue to follow up and assist.  Employment status:  Retired Nurse, adult PT Recommendations:  [place] / Referral to community  resources:  [place]  Patient/Family's Response to care: Patient's daughter refused SNF and wants to take patient home.  Patient/Family's Understanding of and Emotional Response to Diagnosis, Current Treatment, and Prognosis: Patient and her daughter were both pleasant and thanked social work Theatre manager for her assistance.  Emotional Assessment Appearance:  Appears stated age Attitude/Demeanor/Rapport:    Affect (typically observed):  Calm, Pleasant, Appropriate Orientation:  Oriented to Self Alcohol / Substance use:  Not Applicable Psych involvement (Current and /or in the community):  No (Comment)  Discharge Needs  Concerns to be addressed:  Care Coordination, Discharge Planning Concerns Readmission within the last 30 days:  No Current discharge risk:  Cognitively Impaired, Dependent with Mobility Barriers to Discharge:  Continued Medical Work up   Smith Mince, Student-Social Work 11/06/2017, 3:48 PM

## 2017-11-06 NOTE — Progress Notes (Signed)
*  PRELIMINARY RESULTS* Echocardiogram 2D Echocardiogram has been performed.  Sherrie Sport 11/06/2017, 10:03 AM

## 2017-11-06 NOTE — Evaluation (Signed)
Physical Therapy Evaluation Patient Details Name: Heidi Hanna MRN: [id number] DOB: 12/31/1932 Today's Date: 11/06/2017   History of Present Illness  Pt is an84 y.o.femalewho presents with confusion, decreased appetite, complaint of back pain, panniculitis. Symptoms have been progressive over the last couple of days according to her daughter. Patient is unable to contribute much information to her HPI due to her confusion. On evaluation here in the ED she is found to have a UTI. She is also found to have likely candidal-induced panniculitis. Hospitalist were called for admission.  Assessent includes: acute UTI, acute on chronic extensive skin candidiasis/panniculitis, DM II, HTN, HLD, GERD, and acute confusion.    Clinical Impression  Pt presents with deficits in strength, transfers, mobility, gait, balance, and activity tolerance.  Pt required +2 mod to max A with bed mobility tasks and +2 mod A with transfers.  Pt was able to clear buttocks from EOB but was unable to come to full standing.  Pt unable to advance either LE to attempt a step.  Pt's living situation and PLOF are unclear secondary to pt being a poor historian with no family present but with the extent of pt's current functional deficits it is likely that pt would be unsafe to return to prior living situation at this time.  Pt will benefit from PT services in a SNF setting upon discharge to safely address above deficits for decreased caregiver assistance and eventual return to PLOF.      Follow Up Recommendations SNF    Equipment Recommendations  Other (comment)(TBD at next venue of care)    Recommendations for Other Services       Precautions / Restrictions Precautions Precautions: Fall Restrictions Weight Bearing Restrictions: No      Mobility  Bed Mobility Overal bed mobility: Needs Assistance Bed Mobility: Supine to Sit;Sit to Supine     Supine to sit: +2 for physical assistance;Mod assist;Max assist Sit to  supine: +2 for physical assistance;Mod assist;Max assist   General bed mobility comments: Heavy assistance and cueing for sequencing required  Transfers Overall transfer level: Needs assistance Equipment used: Rolling walker (2 wheeled) Transfers: Sit to/from Stand Sit to Stand: +2 physical assistance;Mod assist         General transfer comment: Pt able to clear buttocks from EOB but unable to stand fully upright with significant trunk flexion while in standing  Ambulation/Gait             General Gait Details: Pt unable to advance either LE while in standing  Stairs            Wheelchair Mobility    Modified Rankin (Stroke Patients Only)       Balance Overall balance assessment: Needs assistance Sitting-balance support: Feet supported;Feet unsupported;Bilateral upper extremity supported Sitting balance-Leahy Scale: Fair     Standing balance support: Bilateral upper extremity supported Standing balance-Leahy Scale: Poor Standing balance comment: Trunk flexed with pt unable to stand upright with heavy leaning on RW with BUEs                             Pertinent Vitals/Pain Pain Assessment: 0-10 Pain Score: (Pt unable to rate, reports pain as "a lot" but NAD during session) Pain Location: L hip Pain Descriptors / Indicators: Other (Comment)(Pt unable to describe) Pain Intervention(s): Monitored during session    [place] expects to be discharged to:: Private residence(Pt is a poor historian and accuracy of history unkown, no  family present to assist) Living Arrangements: Children Available Help at Discharge: Family Type of Home: House         Home Equipment: Gilford Rile - 2 wheels;Other (comment)(Pt unsure of type of walker)      Prior Function           Comments: Pt reports being able to do "a little" walking in the home with a walker     Hand Dominance        Extremity/Trunk Assessment   Upper Extremity  Assessment Upper Extremity Assessment: Generalized weakness    Lower Extremity Assessment Lower Extremity Assessment: Generalized weakness;Difficult to assess due to impaired cognition       Communication   Communication: HOH  Cognition Arousal/Alertness: Awake/alert Behavior During Therapy: WFL for tasks assessed/performed Overall Cognitive Status: No family/caregiver present to determine baseline cognitive functioning                                        General Comments      Exercises Total Joint Exercises Ankle Circles/Pumps: AROM;Both;10 reps;AAROM(Max verbal and tactile cues for participation with all therex) Quad Sets: Strengthening;Both;5 reps;10 reps Heel Slides: AAROM;Both;10 reps Hip ABduction/ADduction: AAROM;Both;10 reps Straight Leg Raises: AAROM;Both;10 reps Long Arc Quad: AAROM;Both;10 reps Knee Flexion: AAROM;Both;10 reps   Assessment/Plan    PT Assessment Patient needs continued PT services  PT Problem List Decreased strength;Decreased activity tolerance;Decreased balance;Decreased mobility       PT Treatment Interventions DME instruction;Gait training;Stair training;Functional mobility training;Neuromuscular re-education;Balance training;Therapeutic exercise;Therapeutic activities;Patient/family education    PT Goals (Current goals can be found in the Care Plan section)  Acute Rehab PT Goals PT Goal Formulation: Patient unable to participate in goal setting Time For Goal Achievement: 11/19/17 Potential to Achieve Goals: Fair    Frequency Min 2X/week   Barriers to discharge        Co-evaluation               AM-PAC PT "6 Clicks" Daily Activity  Outcome Measure Difficulty turning over in bed (including adjusting bedclothes, sheets and blankets)?: Unable Difficulty moving from lying on back to sitting on the side of the bed? : Unable Difficulty sitting down on and standing up from a chair with arms (e.g., wheelchair,  bedside commode, etc,.)?: Unable Help needed moving to and from a bed to chair (including a wheelchair)?: Total Help needed walking in hospital room?: Total Help needed climbing 3-5 steps with a railing? : Total 6 Click Score: 6    End of Session Equipment Utilized During Treatment: Gait belt Activity Tolerance: Patient tolerated treatment well Patient left: in bed;with call bell/phone within reach;with bed alarm set(No SCDs per nursing secondary to causing pt pain) Nurse Communication: Mobility status PT Visit Diagnosis: Difficulty in walking, not elsewhere classified (R26.2);Muscle weakness (generalized) (M62.81)    Time: 6073-7106 PT Time Calculation (min) (ACUTE ONLY): 26 min   Charges:   PT Evaluation $PT Eval Low Complexity: 1 Low PT Treatments $Therapeutic Exercise: 8-22 mins   PT G Codes:        DRoyetta Asal PT, DPT 11/06/17, 1:55 PM

## 2017-11-06 NOTE — Care Management Note (Signed)
Case Management Note  Patient Details  Name: Heidi Hanna MRN: [id number] Date of Birth: [birth year]  Subjective/Objective:  Met with daughter at bedside to discuss discharge plan. Patient lives with the daughter. Daughter is refusing SNF because she is unable to afford the copays. Discussed home health options. Daughter states that the last time patient had home health she was belligerent with the staff. She states her mom is very stubborn and not willing to work to make changes. Patient uses a wheelchair during the day. She is able to get her self to and from the bathroom but other thaan that she does not ambulate. Offered SN, SW and HHA. She again doesn't feel this would benefit her mom at this time because her mother has not been cooperative in the past. She has "doctors [employer]" coming out to the home. She was encouraged to speak with them about it further if she decided that home health was an option. Informed her about looking up home health agencies on [email or web address].                  Action/Plan: Daughter refused home health. Case closed.   Expected Discharge Date:                  Expected Discharge Plan:  Home/Self Care  In-House Referral:     Discharge planning Services  CM Consult  Post Acute Care Choice:    Choice offered to:  Adult Children  DME Arranged:    DME Agency:     HH Arranged:  Patient Refused [place] Agency:     Status of Service:  Completed, signed off  If discussed at [facility] of Stay Meetings, dates discussed:    Additional Comments:  Jolly Mango, RN 11/06/2017, 3:24 PM

## 2017-11-07 LAB — CBC
HEMATOCRIT: 39 % (ref 35.0–47.0)
Hemoglobin: 12.6 g/dL (ref 12.0–16.0)
MCH: 28.9 pg (ref 26.0–34.0)
MCHC: 32.2 g/dL (ref 32.0–36.0)
MCV: 89.8 fL (ref 80.0–100.0)
Platelets: 382 10*3/uL (ref 150–440)
RBC: 4.35 MIL/uL (ref 3.80–5.20)
RDW: 14.6 % — AB (ref 11.5–14.5)
WBC: 15.3 10*3/uL — ABNORMAL HIGH (ref 3.6–11.0)

## 2017-11-07 LAB — GLUCOSE, CAPILLARY
GLUCOSE-CAPILLARY: 207 mg/dL — AB (ref 65–99)
Glucose-Capillary: 177 mg/dL — ABNORMAL HIGH (ref 65–99)
Glucose-Capillary: 168 mg/dL — ABNORMAL HIGH (ref 65–99)
Glucose-Capillary: 222 mg/dL — ABNORMAL HIGH (ref 65–99)

## 2017-11-07 LAB — URINE CULTURE: Culture: NO GROWTH

## 2017-11-07 LAB — CULTURE, BLOOD (ROUTINE X 2): SPECIAL REQUESTS: ADEQUATE

## 2017-11-07 LAB — CREATININE, SERUM
Creatinine, Ser: 0.81 mg/dL (ref 0.44–1.00)
GFR calc non Af Amer: >60 mL/min (ref >60)

## 2017-11-07 MED ORDER — HALOPERIDOL LACTATE 5 MG/ML IJ SOLN
2.0000 mg | Freq: Once | INTRAMUSCULAR | Status: AC
  Administered 2017-11-07: 2 mg via INTRAVENOUS
  Filled 2017-11-07: qty 1

## 2017-11-07 NOTE — Progress Notes (Signed)
Inpatient Diabetes Program Recommendations  AACE/ADA: New Consensus Statement on Inpatient Glycemic Control (2015)  Target Ranges:  Prepandial:   less than 140 mg/dL      Peak postprandial:   less than 180 mg/dL (1-2 hours)      Critically ill patients:  140 - 180 mg/dL   Results for DENENE, ALAMILLO (MRN [id number]) as of 11/07/2017 11:02  Ref. Range 11/06/2017 07:27 11/06/2017 12:43 11/06/2017 17:16 11/06/2017 22:33 11/07/2017 07:43  Glucose-Capillary Latest Ref Range: 65 - 99 mg/dL 129 (H) 266 (H) 249 (H) 210 (H) 177 (H)   Review of Glycemic Control  Diabetes history: DM2 Outpatient Diabetes medications: Lantus 20 units BID, Humalog 5 units TID with meals Current orders for Inpatient glycemic control: Lantus 10 units BID, Novolog 5 units TID with meals for meal coverage, Novolog 0-9 units TID with meals, Novolog 0-5 units QHS  Inpatient Diabetes Program Recommendations:  Insulin - Basal: Please consider increasing morning dose of Lantus to 14 units QAM and continue same bedtime dose (Lantus 10 units QHS). Insulin - Meal Coverage: No meal coverage given on 11/06/16 or this morning due to poor PO intake.  Thanks, Barnie Alderman, RN, MSN, CDE Diabetes Coordinator Inpatient Diabetes Program [phone] (Team Pager from 8am to 5pm)

## 2017-11-07 NOTE — Progress Notes (Signed)
Pt. Continues to try and get OOB. Dr. Duane Boston nitified and ordered haldol 2mg  IV once

## 2017-11-07 NOTE — Progress Notes (Signed)
PT Cancellation Note  Patient Details Name: Heidi Hanna MRN: [id number] DOB: 03-05-1933   Cancelled Treatment:    Reason Eval/Treat Not Completed: Patient declined, no reason specified;Patient's level of consciousness; Pt very drowsy but able to open eyes with verbal and tactile stimulation.  Pt clearly stated she did not want to get up with PT today, stated "No".  Pt required Max A +2 to scoot up in bed.  RN present for medication administration and assisted PT with encouragement towards pt to get up to bedside chair.  Pt continued to look away from PT and not answer questions.  Will continue to follow as appropriate.  Shanyla Marconi A Lux Skilton, PT 11/07/2017, 12:47 PM

## 2017-11-07 NOTE — Progress Notes (Signed)
[place] at [place] NAME: Heidi Hanna    MR#:  [id number]  DATE OF BIRTH:  04/15/1933  SUBJECTIVE:  CHIEF COMPLAINT:   Chief Complaint  Patient presents with  . Altered Mental Status  . Back Pain  no complaints, case discussed with the patient's daughter with all questions answered-daughter to consider facility placement versus home, would like to talk to case management/social services before making that determination  REVIEW OF SYSTEMS:  CONSTITUTIONAL: No fever, fatigue or weakness.  EYES: No blurred or double vision.  EARS, NOSE, AND THROAT: No tinnitus or ear pain.  RESPIRATORY: No cough, shortness of breath, wheezing or hemoptysis.  CARDIOVASCULAR: No chest pain, orthopnea, edema.  GASTROINTESTINAL: No nausea, vomiting, diarrhea or abdominal pain.  GENITOURINARY: No dysuria, hematuria.  ENDOCRINE: No polyuria, nocturia,  HEMATOLOGY: No anemia, easy bruising or bleeding SKIN: No rash or lesion. MUSCULOSKELETAL: No joint pain or arthritis.   NEUROLOGIC: No tingling, numbness, weakness.  PSYCHIATRY: No anxiety or depression.   ROS  DRUG ALLERGIES:   Allergies  Allergen Reactions  . Shellfish Allergy Nausea And Vomiting  . Penicillins Rash and Other (See Comments)    Has patient had a PCN reaction causing immediate rash, facial/tongue/throat swelling, SOB or lightheadedness with hypotension: No Has patient had a PCN reaction causing severe rash involving mucus membranes or skin necrosis: No Has patient had a PCN reaction that required hospitalization: No Has patient had a PCN reaction occurring within the last 10 years: No If all of the above answers are "NO", then may proceed with Cephalosporin use.     VITALS:  Blood pressure (!) 143/88, pulse (!) 52, temperature 99 F (37.2 C), temperature source Oral, resp. rate 16, height 5\' 3"  (1.6 m), weight 104.3 kg (230 lb), SpO2 96 %.  PHYSICAL EXAMINATION:  GENERAL:  84  y.o.-year-old patient lying in the bed with no acute distress.  EYES: Pupils equal, round, reactive to light and accommodation. No scleral icterus. Extraocular muscles intact.  HEENT: Head atraumatic, normocephalic. Oropharynx and nasopharynx clear.  NECK:  Supple, no jugular venous distention. No thyroid enlargement, no tenderness.  LUNGS: Normal breath sounds bilaterally, no wheezing, rales,rhonchi or crepitation. No use of accessory muscles of respiration.  CARDIOVASCULAR: S1, S2 normal. No murmurs, rubs, or gallops.  ABDOMEN: Soft, nontender, nondistended. Bowel sounds present. No organomegaly or mass.  EXTREMITIES: No pedal edema, cyanosis, or clubbing.  NEUROLOGIC: Cranial nerves II through XII are intact. Muscle strength 5/5 in all extremities. Sensation intact. Gait not checked.  PSYCHIATRIC: The patient is alert and oriented x 3.  SKIN: No obvious rash, lesion, or ulcer.   Physical Exam LABORATORY PANEL:   CBC Recent Labs  Lab 11/07/17 0339  WBC 15.3*  HGB 12.6  HCT 39.0  PLT 382   ------------------------------------------------------------------------------------------------------------------  Chemistries  Recent Labs  Lab 11/03/17 1649 11/04/17 0332 11/07/17 0339  NA 143 139  --   K 4.2 3.3*  --   CL 107 103  --   CO2 26 27  --   GLUCOSE 124* 72  --   BUN 21* 16  --   CREATININE 0.78 0.68 0.81  CALCIUM 9.2 8.5*  --   AST 22  --   --   ALT 11*  --   --   ALKPHOS 57  --   --   BILITOT 0.6  --   --    ------------------------------------------------------------------------------------------------------------------  Cardiac Enzymes Recent Labs  Lab 11/03/17 1649  TROPONINI 0.09*   ------------------------------------------------------------------------------------------------------------------  RADIOLOGY:  No results found.  ASSESSMENT AND PLAN:  1acute probable urinary tract infection Resolved Treated with course of Rocephin while in  house  2acute on chronic extensive skin candidiasis/panniculitis  Resolving ContinueDiflucanandtopical nystatin  3chronic diabetes mellitus type 2 Stable on current regimen  4chronic benign essential hypertension Stable Continuehome meds  5chronic hyperlipidemia, unspecified Stable on statin therapy  6chronic GERD without esophagitis PPI daily  7acute confusion Resolved Confused/disoriented at baseline representing vascular dementia given history of CVA Continue increase nursing care as needed, aspiration/fall precautions, skin care precautions  8acute abnormal blood cultures 2 out of 2 bottles positive for coag negative staph consistent with contamination, repeat blood cultures negative, echocardiogram was normal   All the records are reviewed and case discussed with Care Management/Social Workerr. Management plans discussed with the patient, family and they are in agreement.  CODE STATUS: full  TOTAL TIME TAKING CARE OF THIS PATIENT: 40 minutes.     POSSIBLE D/C IN 1-2 DAYS, DEPENDING ON CLINICAL CONDITION.   Avel Peace Salary M.D on 11/07/2017   Between 7am to 6pm - Pager - [phone]  After 6pm go to [email or web address] - password [email or web address] [place] [facility]  Office  [phone]  CC: Primary care physician; System, Pcp Not In  Note: This dictation was prepared with Dragon dictation along with smaller phrase technology. Any transcriptional errors that result from this process are unintentional.

## 2017-11-08 ENCOUNTER — Inpatient Hospital Stay: Payer: Medicare Other

## 2017-11-08 LAB — GLUCOSE, CAPILLARY
GLUCOSE-CAPILLARY: 170 mg/dL — AB (ref 65–99)
Glucose-Capillary: 155 mg/dL — ABNORMAL HIGH (ref 65–99)
Glucose-Capillary: 225 mg/dL — ABNORMAL HIGH (ref 65–99)
Glucose-Capillary: 195 mg/dL — ABNORMAL HIGH (ref 65–99)

## 2017-11-08 LAB — CULTURE, BLOOD (ROUTINE X 2): Special Requests: ADEQUATE

## 2017-11-08 MED ORDER — LACTULOSE 10 GM/15ML PO SOLN
20.0000 g | Freq: Two times a day (BID) | ORAL | Status: DC
  Administered 2017-11-08 – 2017-11-09 (×2): 20 g via ORAL
  Filled 2017-11-08 (×2): qty 30

## 2017-11-08 MED ORDER — FLEET ENEMA 7-19 GM/118ML RE ENEM
1.0000 | ENEMA | Freq: Once | RECTAL | Status: AC
  Administered 2017-11-08: 1 via RECTAL

## 2017-11-08 MED ORDER — ASPIRIN EC 325 MG PO TBEC
325.0000 mg | DELAYED_RELEASE_TABLET | Freq: Every day | ORAL | Status: DC
  Administered 2017-11-08 – 2017-11-10 (×3): 325 mg via ORAL
  Filled 2017-11-08 (×3): qty 1

## 2017-11-08 NOTE — Progress Notes (Signed)
[place] at [place] NAME: Heidi Hanna    MR#:  [id number]  DATE OF BIRTH:  [birth year]  SUBJECTIVE:  CHIEF COMPLAINT:   Chief Complaint  Patient presents with  . Altered Mental Status  . Back Pain  Patient with continued confusion/disorientation, daughter at the bedside-concern for facial asymmetry, will proceed with CT of the head for further evaluation  REVIEW OF SYSTEMS:  CONSTITUTIONAL: No fever, fatigue or weakness.  EYES: No blurred or double vision.  EARS, NOSE, AND THROAT: No tinnitus or ear pain.  RESPIRATORY: No cough, shortness of breath, wheezing or hemoptysis.  CARDIOVASCULAR: No chest pain, orthopnea, edema.  GASTROINTESTINAL: No nausea, vomiting, diarrhea or abdominal pain.  GENITOURINARY: No dysuria, hematuria.  ENDOCRINE: No polyuria, nocturia,  HEMATOLOGY: No anemia, easy bruising or bleeding SKIN: No rash or lesion. MUSCULOSKELETAL: No joint pain or arthritis.   NEUROLOGIC: No tingling, numbness, weakness.  PSYCHIATRY: No anxiety or depression.   ROS  DRUG ALLERGIES:   Allergies  Allergen Reactions  . Shellfish Allergy Nausea And Vomiting  . Penicillins Rash and Other (See Comments)    Has patient had a PCN reaction causing immediate rash, facial/tongue/throat swelling, SOB or lightheadedness with hypotension: No Has patient had a PCN reaction causing severe rash involving mucus membranes or skin necrosis: No Has patient had a PCN reaction that required hospitalization: No Has patient had a PCN reaction occurring within the last 10 years: No If all of the above answers are "NO", then may proceed with Cephalosporin use.     VITALS:  Blood pressure (!) 140/52, pulse 73, temperature 98.2 F (36.8 C), temperature source Oral, resp. rate 20, height 5\' 3"  (1.6 m), weight 104.3 kg (230 lb), SpO2 94 %.  PHYSICAL EXAMINATION:  GENERAL:  84 y.o.-year-old patient lying in the bed with no acute distress.  EYES: Pupils  equal, round, reactive to light and accommodation. No scleral icterus. Extraocular muscles intact.  HEENT: Head atraumatic, normocephalic. Oropharynx and nasopharynx clear.  NECK:  Supple, no jugular venous distention. No thyroid enlargement, no tenderness.  LUNGS: Normal breath sounds bilaterally, no wheezing, rales,rhonchi or crepitation. No use of accessory muscles of respiration.  CARDIOVASCULAR: S1, S2 normal. No murmurs, rubs, or gallops.  ABDOMEN: Soft, nontender, nondistended. Bowel sounds present. No organomegaly or mass.  EXTREMITIES: No pedal edema, cyanosis, or clubbing.  NEUROLOGIC: Cranial nerves II through XII are intact. Muscle strength 5/5 in all extremities. Sensation intact. Gait not checked.  PSYCHIATRIC: The patient is alert and oriented x 3.  SKIN: No obvious rash, lesion, or ulcer.   Physical Exam LABORATORY PANEL:   CBC Recent Labs  Lab 11/07/17 0339  WBC 15.3*  HGB 12.6  HCT 39.0  PLT 382   ------------------------------------------------------------------------------------------------------------------  Chemistries  Recent Labs  Lab 11/03/17 1649 11/04/17 0332 11/07/17 0339  NA 143 139  --   K 4.2 3.3*  --   CL 107 103  --   CO2 26 27  --   GLUCOSE 124* 72  --   BUN 21* 16  --   CREATININE 0.78 0.68 0.81  CALCIUM 9.2 8.5*  --   AST 22  --   --   ALT 11*  --   --   ALKPHOS 57  --   --   BILITOT 0.6  --   --    ------------------------------------------------------------------------------------------------------------------  Cardiac Enzymes Recent Labs  Lab 11/03/17 1649  TROPONINI 0.09*   ------------------------------------------------------------------------------------------------------------------  RADIOLOGY:  No results  found.  ASSESSMENT AND PLAN:  1acute probable urinary tract infection Resolved Treated with course of Rocephin while in house  2acute on chronic extensive  skin candidiasis/panniculitis Resolving ContinueDiflucanandtopical nystatin  3chronic diabetes mellitus type 2 Stable on current regimen  4chronic benign essential hypertension Stable Continuehome meds  5chronic hyperlipidemia, unspecified Stable on statin therapy  6chronic GERD without esophagitis PPI daily  7acute confusion Resolved Confused/disoriented at baseline representing vascular dementia given history of CVA Continue increase nursing care as needed, aspiration/fall precautions, skin care precautions  8acute abnormal blood cultures 2 out of 2 bottles positive for coag negative staph consistent with contamination, repeat blood cultures negative, echocardiogram was normal   9 ?acute facial asymmetry No new motor deficits, daughter concerned for facial asymmetry Check CT of the head for further evaluation, check lipids in the morning, aspirin daily, neurochecks per routine   All the records are reviewed and case discussed with Care Management/Social Workerr. Management plans discussed with the patient, family and they are in agreement.  CODE STATUS: full  TOTAL TIME TAKING CARE OF THIS PATIENT: 35 minutes.     POSSIBLE D/C IN 1-2 DAYS, DEPENDING ON CLINICAL CONDITION.   Avel Peace Salary M.D on 11/08/2017   Between 7am to 6pm - Pager - [phone]  After 6pm go to [email or web address] - password [email or web address] [facility]  Office  [phone]  CC: Primary care physician; System, Pcp Not In  Note: This dictation was prepared with Dragon dictation along with smaller phrase technology. Any transcriptional errors that result from this process are unintentional.

## 2017-11-08 NOTE — Progress Notes (Signed)
No BM results after fleet enema. MD Salary paged and received verbal orders for Lactulose 30 ml BID and Tap water enema x 3 PRN if no results with lactulose. Order placed

## 2017-11-08 NOTE — Care Management Note (Signed)
Case Management Note  Patient Details  Name: Jilene Spohr MRN: [id number] Date of Birth: 05/27/1933  Subjective/Objective:      Left phone message for daughter Venetia Constable to please call me, and also asked Ms Jon's nurse to call me when daughter arrives to transport Ms [place] home today.               Action/Plan:   Expected Discharge Date:                  Expected Discharge Plan:  Home/Self Care  In-House Referral:     Discharge planning Services  CM Consult  Post Acute Care Choice:    Choice offered to:  Adult Children  DME Arranged:    DME Agency:     HH Arranged:  Patient Refused [facility] Agency:     Status of Service:  Completed, signed off  If discussed at [place] of Stay Meetings, dates discussed:    Additional Comments:  Merlie Noga A, RN 11/08/2017, 9:43 AM

## 2017-11-08 NOTE — Clinical Social Work Note (Signed)
CSW spoke with the patient's HCPOA Nira Conn who has decided to pursue [facility] at [facility]. CSW has contacted [facility] and they have begun the authorization process. CSW will update when authorization is received.  Santiago Bumpers, MSW, Latanya Presser [phone]

## 2017-11-09 LAB — LIPID PANEL
Cholesterol: 121 mg/dL (ref 0–200)
HDL: 36 mg/dL — AB (ref >40)
LDL CALC: 67 mg/dL (ref 0–99)
TRIGLYCERIDES: 89 mg/dL (ref <150)
Total CHOL/HDL Ratio: 3.4 RATIO
VLDL: 18 mg/dL (ref 0–40)

## 2017-11-09 LAB — GLUCOSE, CAPILLARY
GLUCOSE-CAPILLARY: 225 mg/dL — AB (ref 65–99)
GLUCOSE-CAPILLARY: 206 mg/dL — AB (ref 65–99)
Glucose-Capillary: 199 mg/dL — ABNORMAL HIGH (ref 65–99)
Glucose-Capillary: 229 mg/dL — ABNORMAL HIGH (ref 65–99)

## 2017-11-09 MED ORDER — LACTULOSE 10 GM/15ML PO SOLN
30.0000 g | Freq: Two times a day (BID) | ORAL | Status: DC
  Filled 2017-11-09 (×2): qty 60

## 2017-11-09 NOTE — Progress Notes (Signed)
[facility] NAME: Heidi Hanna    MR#:  [id number]  DATE OF BIRTH:  1933/07/28  SUBJECTIVE:  CHIEF COMPLAINT:   Chief Complaint  Patient presents with  . Altered Mental Status  . Back Pain  Patient without complaint, continue chronic confusion/disorientation, awaiting placement, per nursing staff continue constipation-we will increase lactulose to twice daily and start tap water enemas  REVIEW OF SYSTEMS:  CONSTITUTIONAL: No fever, fatigue or weakness.  EYES: No blurred or double vision.  EARS, NOSE, AND THROAT: No tinnitus or ear pain.  RESPIRATORY: No cough, shortness of breath, wheezing or hemoptysis.  CARDIOVASCULAR: No chest pain, orthopnea, edema.  GASTROINTESTINAL: No nausea, vomiting, diarrhea or abdominal pain.  GENITOURINARY: No dysuria, hematuria.  ENDOCRINE: No polyuria, nocturia,  HEMATOLOGY: No anemia, easy bruising or bleeding SKIN: No rash or lesion. MUSCULOSKELETAL: No joint pain or arthritis.   NEUROLOGIC: No tingling, numbness, weakness.  PSYCHIATRY: No anxiety or depression.   ROS  DRUG ALLERGIES:   Allergies  Allergen Reactions  . Shellfish Allergy Nausea And Vomiting  . Penicillins Rash and Other (See Comments)    Has patient had a PCN reaction causing immediate rash, facial/tongue/throat swelling, SOB or lightheadedness with hypotension: No Has patient had a PCN reaction causing severe rash involving mucus membranes or skin necrosis: No Has patient had a PCN reaction that required hospitalization: No Has patient had a PCN reaction occurring within the last 10 years: No If all of the above answers are "NO", then may proceed with Cephalosporin use.     VITALS:  Blood pressure (!) 144/60, pulse 73, temperature 98.9 F (37.2 C), temperature source Oral, resp. rate 20, height 5\' 3"  (1.6 m), weight 104.3 kg (230 lb), SpO2 93 %.  PHYSICAL EXAMINATION:  GENERAL:  84 y.o.-year-old patient lying in the bed  with no acute distress.  EYES: Pupils equal, round, reactive to light and accommodation. No scleral icterus. Extraocular muscles intact.  HEENT: Head atraumatic, normocephalic. Oropharynx and nasopharynx clear.  NECK:  Supple, no jugular venous distention. No thyroid enlargement, no tenderness.  LUNGS: Normal breath sounds bilaterally, no wheezing, rales,rhonchi or crepitation. No use of accessory muscles of respiration.  CARDIOVASCULAR: S1, S2 normal. No murmurs, rubs, or gallops.  ABDOMEN: Soft, nontender, nondistended. Bowel sounds present. No organomegaly or mass.  EXTREMITIES: No pedal edema, cyanosis, or clubbing.  NEUROLOGIC: Cranial nerves II through XII are intact. Muscle strength 5/5 in all extremities. Sensation intact. Gait not checked.  PSYCHIATRIC: The patient is alert and oriented x 3.  SKIN: No obvious rash, lesion, or ulcer.   Physical Exam LABORATORY PANEL:   CBC Recent Labs  Lab 11/07/17 0339  WBC 15.3*  HGB 12.6  HCT 39.0  PLT 382   ------------------------------------------------------------------------------------------------------------------  Chemistries  Recent Labs  Lab 11/03/17 1649 11/04/17 0332 11/07/17 0339  NA 143 139  --   K 4.2 3.3*  --   CL 107 103  --   CO2 26 27  --   GLUCOSE 124* 72  --   BUN 21* 16  --   CREATININE 0.78 0.68 0.81  CALCIUM 9.2 8.5*  --   AST 22  --   --   ALT 11*  --   --   ALKPHOS 57  --   --   BILITOT 0.6  --   --    ------------------------------------------------------------------------------------------------------------------  Cardiac Enzymes Recent Labs  Lab 11/03/17 1649  TROPONINI 0.09*   ------------------------------------------------------------------------------------------------------------------  RADIOLOGY:  Ct Head Wo Contrast  Result Date: 11/08/2017 CLINICAL DATA:  Altered level of consciousness. EXAM: CT HEAD WITHOUT CONTRAST TECHNIQUE: Contiguous axial images were obtained from the base  of the skull through the vertex without intravenous contrast. COMPARISON:  CT head 11/03/2017 FINDINGS: Brain: Moderate atrophy. Negative for hydrocephalus. Chronic microvascular ischemic changes throughout the white matter are stable. Negative for acute infarct.  Negative for hemorrhage or mass Vascular: Atherosclerotic calcification in the carotid siphon bilaterally. Negative for acute thrombosis. Skull: Negative Sinuses/Orbits: Negative Other: None IMPRESSION: Atrophy and chronic microvascular ischemia. No acute abnormality and no change from 5 days prior Electronically Signed   By: Franchot Gallo M.D.   On: 11/08/2017 13:36    ASSESSMENT AND PLAN:  1acute probable urinary tract infection Resolved Treated with course of Rocephin while in house Urine culture negative  2acute on chronic extensive skin candidiasis/panniculitis Resolving ContinueDiflucan for 7-day courseandtopical nystatin  3chronic diabetes mellitus type 2 Stable on current regimen  4chronic benign essential hypertension Stable Continuehome meds  5chronic hyperlipidemia, unspecified Stable on statin therapy  6chronic GERD without esophagitis PPI daily  7acute confusion Resolved Confused/disoriented at baseline representing vascular dementia given history of CVA Continue increase nursing care as needed, aspiration/fall precautions, skin care precautions  8acute abnormal blood cultures 2 out of 2 bottles positive for coag negative staphconsistent with contamination, repeat blood cultures negative,echocardiogram was normal  9 ?acute facial asymmetry No new motor deficits, daughter concerned for facial asymmetry, acute CVA doubted CT head noted for atrophy, aspirin daily, neurochecks per routine  Disposition to skilled nursing facility when bed is available   All the records are reviewed and case discussed with Care Management/Social Workerr. Management plans discussed with the  patient, family and they are in agreement.  CODE STATUS: full  TOTAL TIME TAKING CARE OF THIS PATIENT: 35 minutes.     POSSIBLE D/C IN 1-2 DAYS, DEPENDING ON CLINICAL CONDITION.   Avel Peace Sheily Lineman M.D on 11/09/2017   Between 7am to 6pm - Pager - [phone]  After 6pm go to [email or web address] - password [email or web address] [facility]  Office  [phone]  CC: Primary care physician; System, Pcp Not In  Note: This dictation was prepared with Dragon dictation along with smaller phrase technology. Any transcriptional errors that result from this process are unintentional.

## 2017-11-10 DIAGNOSIS — Z7982 Long term (current) use of aspirin: Secondary | ICD-10-CM | POA: Diagnosis not present

## 2017-11-10 DIAGNOSIS — W06XXXA Fall from bed, initial encounter: Secondary | ICD-10-CM | POA: Diagnosis not present

## 2017-11-10 DIAGNOSIS — M793 Panniculitis, unspecified: Secondary | ICD-10-CM | POA: Diagnosis not present

## 2017-11-10 DIAGNOSIS — K219 Gastro-esophageal reflux disease without esophagitis: Secondary | ICD-10-CM | POA: Diagnosis not present

## 2017-11-10 DIAGNOSIS — Z79899 Other long term (current) drug therapy: Secondary | ICD-10-CM | POA: Diagnosis not present

## 2017-11-10 DIAGNOSIS — S0181XA Laceration without foreign body of other part of head, initial encounter: Secondary | ICD-10-CM | POA: Diagnosis not present

## 2017-11-10 DIAGNOSIS — R404 Transient alteration of awareness: Secondary | ICD-10-CM | POA: Diagnosis not present

## 2017-11-10 DIAGNOSIS — E1165 Type 2 diabetes mellitus with hyperglycemia: Secondary | ICD-10-CM | POA: Diagnosis not present

## 2017-11-10 DIAGNOSIS — Y998 Other external cause status: Secondary | ICD-10-CM | POA: Diagnosis not present

## 2017-11-10 DIAGNOSIS — R1312 Dysphagia, oropharyngeal phase: Secondary | ICD-10-CM | POA: Diagnosis not present

## 2017-11-10 DIAGNOSIS — Y9389 Activity, other specified: Secondary | ICD-10-CM | POA: Diagnosis not present

## 2017-11-10 DIAGNOSIS — W19XXXA Unspecified fall, initial encounter: Secondary | ICD-10-CM | POA: Diagnosis not present

## 2017-11-10 DIAGNOSIS — S0990XA Unspecified injury of head, initial encounter: Secondary | ICD-10-CM | POA: Diagnosis not present

## 2017-11-10 DIAGNOSIS — Z794 Long term (current) use of insulin: Secondary | ICD-10-CM | POA: Diagnosis not present

## 2017-11-10 DIAGNOSIS — M25562 Pain in left knee: Secondary | ICD-10-CM | POA: Diagnosis not present

## 2017-11-10 DIAGNOSIS — E119 Type 2 diabetes mellitus without complications: Secondary | ICD-10-CM | POA: Diagnosis not present

## 2017-11-10 DIAGNOSIS — Z7401 Bed confinement status: Secondary | ICD-10-CM | POA: Diagnosis not present

## 2017-11-10 DIAGNOSIS — I10 Essential (primary) hypertension: Secondary | ICD-10-CM | POA: Diagnosis not present

## 2017-11-10 DIAGNOSIS — R451 Restlessness and agitation: Secondary | ICD-10-CM | POA: Diagnosis not present

## 2017-11-10 DIAGNOSIS — M25561 Pain in right knee: Secondary | ICD-10-CM | POA: Diagnosis not present

## 2017-11-10 DIAGNOSIS — J45909 Unspecified asthma, uncomplicated: Secondary | ICD-10-CM | POA: Diagnosis not present

## 2017-11-10 DIAGNOSIS — Z23 Encounter for immunization: Secondary | ICD-10-CM | POA: Diagnosis not present

## 2017-11-10 DIAGNOSIS — Y929 Unspecified place or not applicable: Secondary | ICD-10-CM | POA: Diagnosis not present

## 2017-11-10 DIAGNOSIS — N390 Urinary tract infection, site not specified: Secondary | ICD-10-CM | POA: Diagnosis not present

## 2017-11-10 DIAGNOSIS — R2681 Unsteadiness on feet: Secondary | ICD-10-CM | POA: Diagnosis not present

## 2017-11-10 DIAGNOSIS — S0003XA Contusion of scalp, initial encounter: Secondary | ICD-10-CM | POA: Diagnosis not present

## 2017-11-10 LAB — CULTURE, BLOOD (ROUTINE X 2)
Culture: NO GROWTH
Culture: NO GROWTH
SPECIAL REQUESTS: ADEQUATE

## 2017-11-10 LAB — GLUCOSE, CAPILLARY
GLUCOSE-CAPILLARY: 165 mg/dL — AB (ref 65–99)
GLUCOSE-CAPILLARY: 191 mg/dL — AB (ref 65–99)

## 2017-11-10 LAB — CBC
HCT: 35.5 % (ref 35.0–47.0)
Hemoglobin: 11.7 g/dL — ABNORMAL LOW (ref 12.0–16.0)
MCH: 29.5 pg (ref 26.0–34.0)
MCHC: 33 g/dL (ref 32.0–36.0)
MCV: 89.2 fL (ref 80.0–100.0)
PLATELETS: 391 10*3/uL (ref 150–440)
RBC: 3.98 MIL/uL (ref 3.80–5.20)
RDW: 14.8 % — AB (ref 11.5–14.5)
WBC: 14.5 10*3/uL — AB (ref 3.6–11.0)

## 2017-11-10 LAB — CREATININE, SERUM: CREATININE: 0.77 mg/dL (ref 0.44–1.00)

## 2017-11-10 MED ORDER — ASPIRIN 325 MG PO TBEC: 325.0000 mg | DELAYED_RELEASE_TABLET | Freq: Every day | ORAL | 0 refills | Status: AC

## 2017-11-10 MED ORDER — ADULT MULTIVITAMIN W/MINERALS CH: 1.0000 | ORAL_TABLET | Freq: Every day | ORAL | 0 refills | Status: AC

## 2017-11-10 MED ORDER — INSULIN ASPART 100 UNIT/ML ~~LOC~~ SOLN: 5.0000 [IU] | Freq: Three times a day (TID) | SUBCUTANEOUS | 11 refills | Status: AC

## 2017-11-10 MED ORDER — LORAZEPAM 0.5 MG PO TABS
0.5000 mg | ORAL_TABLET | Freq: Two times a day (BID) | ORAL | 0 refills | Status: AC
Start: 2017-11-10 — End: ?

## 2017-11-10 MED ORDER — FLUCONAZOLE 100 MG PO TABS: 100.0000 mg | ORAL_TABLET | Freq: Every day | ORAL | 0 refills | Status: AC

## 2017-11-10 MED ORDER — SENNOSIDES-DOCUSATE SODIUM 8.6-50 MG PO TABS: 2.0000 | ORAL_TABLET | Freq: Every day | ORAL | 0 refills | Status: AC

## 2017-11-10 MED ORDER — MEGESTROL ACETATE 400 MG/10ML PO SUSP: 400.0000 mg | Freq: Two times a day (BID) | ORAL | 0 refills | Status: AC

## 2017-11-10 MED ORDER — INSULIN ASPART 100 UNIT/ML ~~LOC~~ SOLN: 0.0000 [IU] | Freq: Three times a day (TID) | SUBCUTANEOUS | 11 refills | Status: AC

## 2017-11-10 MED ORDER — NYSTATIN 100000 UNIT/GM EX POWD: Freq: Three times a day (TID) | CUTANEOUS | 0 refills | Status: AC

## 2017-11-10 MED ORDER — PREMIER PROTEIN SHAKE: 11.0000 [oz_av] | Freq: Two times a day (BID) | ORAL | 0 refills | Status: AC

## 2017-11-10 MED ORDER — INSULIN GLARGINE 100 UNIT/ML ~~LOC~~ SOLN: 10.0000 [IU] | Freq: Two times a day (BID) | SUBCUTANEOUS | 11 refills | Status: AC

## 2017-11-10 NOTE — Care Management Important Message (Signed)
Important Message  Patient Details  Name: Karren Newland MRN: [id number] Date of Birth: 1933-02-12   Medicare Important Message Given:  Yes    Marshell Garfinkel, RN 11/10/2017, 9:22 AM

## 2017-11-10 NOTE — Discharge Summary (Signed)
Heidi Hanna, is a 84 y.o. female  DOB [birth year]  MRN [id number].  Admission date:  11/03/2017  Admitting Physician  Lance Coon, MD  Discharge Date:  11/10/2017   Primary MD  System, Pcp Not In  Recommendations for primary care physician for things to follow:  Follow-up with the nursing home physician at [facility] in 1 week   Admission Diagnosis  Elevated troponin [R74.8] Elevated brain natriuretic peptide (BNP) level [R79.89] Urinary tract infection without hematuria, site unspecified [N39.0] Altered mental status, unspecified altered mental status type [R41.82]   Discharge Diagnosis  Elevated troponin [R74.8] Elevated brain natriuretic peptide (BNP) level [R79.89] Urinary tract infection without hematuria, site unspecified [N39.0] Altered mental status, unspecified altered mental status type [R41.82]    Principal Problem:   UTI (urinary tract infection) Active Problems:   Diabetes (HCC)   HTN (hypertension)   HLD (hyperlipidemia)   GERD (gastroesophageal reflux disease)   Candida-induced panniculitis      Past Medical History:  Diagnosis Date  . Asthma   . Cancer ([place])   . Collagen vascular disease ([place])   . Diabetes mellitus without complication ([place])   . GERD (gastroesophageal reflux disease)   . HLD (hyperlipidemia)   . Hypertension     Past Surgical History:  Procedure Laterality Date  . CHOLECYSTECTOMY         History of present illness and  Hospital Course:     Kindly see H&P for history of present illness and admission details, please review complete Labs, Consult reports and Test reports for all details in brief  HPI  from the history and physical done on the day of admission  84 year old female patient with confusion, decreased appetite, back pain, pancolitis.  According to  patient's daughter patient was more confused, found to have Candida-induced panniculitis on admission.  She has history of asthma, collagen vascular disease, diabetes mellitus type 2, GERD, essential hypertension, hyperlipidemia.  Hospital Course  acute probable urinary tract infection Treated with Rocephin for 3 days, urine cultures did not show any growth.  2,acute on chronic extensive skin candidiasis/panniculitis, recent patient received nystatin, Diflucan.  Can use nystatin cream.  Continue Diflucan 100 mg p.o. daily for 2 more days  3chronic diabetes mellitus type 2: Admitted to the dose of Levemir, NovoLog.  Patient continues to have poor p.o. intake so adjusted Levemir, NovoLog dose please see discharge reconciliation for dose ordering.  4chronic benign essential hypertension Stable Continuehome meds  5chronic hyperlipidemia, unspecified Stable on statin therapy  6chronic GERD without esophagitis PPI daily  7acute confusion Resolved, use Ativan as needed for breakthrough agitation. Confused/disoriented at baseline representing vascular dementia given history of CVA, baseline dementia.  8acute abnormal blood cultures 2 out of 2 bottles positive for coag negative staphconsistent with contamination, repeat blood cultures negative,echocardiogram was normal  CT head repeated 2 times this hospitalization.  Both times did not show any CVA.  Call patient daughter Nira Conn today and explained that patient got insurance authorization to go to [facility] skilled nursing facility.     Discharge Condition: Stable   Follow UP  Contact information for after-discharge care    Destination    [facility] .   Service:  Skilled Nursing Contact information: [address] [place] [phone]                Discharge Instructions  and  Discharge Medications   Dysphagia 3 diet with thin liquids.   Allergies  as  of 11/10/2017      Reactions   Shellfish Allergy Nausea And Vomiting   Penicillins Rash, Other (See Comments)   Has patient had a PCN reaction causing immediate rash, facial/tongue/throat swelling, SOB or lightheadedness with hypotension: No Has patient had a PCN reaction causing severe rash involving mucus membranes or skin necrosis: No Has patient had a PCN reaction that required hospitalization: No Has patient had a PCN reaction occurring within the last 10 years: No If all of the above answers are "NO", then may proceed with Cephalosporin use.      Medication List    STOP taking these medications   acetaminophen 650 MG CR tablet Commonly known as:  TYLENOL   HUMALOG KWIKPEN 100 UNIT/ML KiwkPen Generic drug:  insulin lispro   LANTUS SOLOSTAR 100 UNIT/ML Solostar Pen Generic drug:  Insulin Glargine Replaced by:  insulin glargine 100 UNIT/ML injection     TAKE these medications   amLODipine 10 MG tablet Commonly known as:  NORVASC Take 10 mg by mouth daily.   Ascorbic Acid 1000 MG Tbcr Take 1,000 mg by mouth daily.   aspirin 325 MG EC tablet Take 1 tablet (325 mg total) by mouth daily. Start taking on:  11/11/2017   fluconazole 100 MG tablet Commonly known as:  DIFLUCAN Take 1 tablet (100 mg total) by mouth daily.   gabapentin 100 MG capsule Commonly known as:  NEURONTIN Take 100 mg by mouth 2 (two) times daily.   guaiFENesin 100 MG/5ML liquid Commonly known as:  ROBITUSSIN Take 200 mg by mouth 3 (three) times daily as needed for cough.   hydrALAZINE 25 MG tablet Commonly known as:  APRESOLINE Take 25 mg by mouth 2 (two) times daily.   insulin aspart 100 UNIT/ML injection Commonly known as:  novoLOG Inject 5 Units into the skin 3 (three) times daily with meals.   insulin aspart 100 UNIT/ML injection Commonly known as:  novoLOG Inject 0-9 Units into the skin 3 (three) times daily before meals.   insulin glargine 100 UNIT/ML injection Commonly known as:   LANTUS Inject 0.1 mLs (10 Units total) into the skin 2 (two) times daily. Replaces:  LANTUS SOLOSTAR 100 UNIT/ML Solostar Pen   LORazepam 0.5 MG tablet Commonly known as:  ATIVAN Take 1-2 tablets (0.5-1 mg total) by mouth 2 (two) times daily. 0.5 mg in the morning and 1 mg at bedtime   losartan 100 MG tablet Commonly known as:  COZAAR Take 100 mg by mouth daily.   megestrol 400 MG/10ML suspension Commonly known as:  MEGACE Take 10 mLs (400 mg total) by mouth 2 (two) times daily.   metoprolol 200 MG 24 hr tablet Commonly known as:  TOPROL-XL Take 200 mg by mouth daily.   multivitamin with minerals Tabs tablet Take 1 tablet by mouth daily. Start taking on:  11/11/2017   nystatin powder Commonly known as:  MYCOSTATIN/NYSTOP Apply topically 3 (three) times daily.   omeprazole 20 MG capsule Commonly known as:  PRILOSEC Take 20 mg by mouth daily.   pravastatin 80 MG tablet Commonly known as:  PRAVACHOL Take 80 mg by mouth daily.   protein supplement shake Liqd Commonly known as:  PREMIER PROTEIN Take 325 mLs (11 oz total) by mouth 2 (two) times daily between meals.   senna-docusate 8.6-50 MG tablet Commonly known as:  Senokot-S Take 2 tablets by mouth at bedtime.   Vitamin D-3 5000 units Tabs Take 5,000 Units by mouth every 3 (three) days.  Diet and Activity recommendation: See Discharge Instructions above   Consults obtained; diabetes coordinator, social worker  Major procedures and Radiology Reports - PLEASE review detailed and final reports for all details, in brief -     Ct Head Wo Contrast  Result Date: 11/08/2017 CLINICAL DATA:  Altered level of consciousness. EXAM: CT HEAD WITHOUT CONTRAST TECHNIQUE: Contiguous axial images were obtained from the base of the skull through the vertex without intravenous contrast. COMPARISON:  CT head 11/03/2017 FINDINGS: Brain: Moderate atrophy. Negative for hydrocephalus. Chronic microvascular ischemic changes  throughout the white matter are stable. Negative for acute infarct.  Negative for hemorrhage or mass Vascular: Atherosclerotic calcification in the carotid siphon bilaterally. Negative for acute thrombosis. Skull: Negative Sinuses/Orbits: Negative Other: None IMPRESSION: Atrophy and chronic microvascular ischemia. No acute abnormality and no change from 5 days prior Electronically Signed   By: Franchot Gallo M.D.   On: 11/08/2017 13:36   Ct Head Wo Contrast  Result Date: 11/03/2017 CLINICAL DATA:  Confusion for 1 week. EXAM: CT HEAD WITHOUT CONTRAST TECHNIQUE: Contiguous axial images were obtained from the base of the skull through the vertex without intravenous contrast. COMPARISON:  CT HEAD December 28, 2011 FINDINGS: Moderately motion degraded examination, mildly improved on repeat imaging. BRAIN: No intraparenchymal hemorrhage, mass effect nor midline shift. The ventricles and sulci are normal for age. Patchy supratentorial white matter hypodensities most compatible with chronic small vessel ischemic disease. No acute large vascular territory infarcts. No abnormal extra-axial fluid collections. Basal cisterns are patent. VASCULAR: Moderate to severe calcific atherosclerosis of the carotid siphons. SKULL: No skull fracture. No significant scalp soft tissue swelling. SINUSES/ORBITS: The mastoid air-cells and included paranasal sinuses are well-aerated.The included ocular globes and orbital contents are non-suspicious. OTHER: Poor dentition. IMPRESSION: 1. No acute intracranial process on this moderately motion degraded examination. Electronically Signed   By: Elon Alas M.D.   On: 11/03/2017 23:18   Dg Chest Portable 1 View  Result Date: 11/03/2017 CLINICAL DATA:  Low back pain and shortness of breath. EXAM: PORTABLE CHEST 1 VIEW COMPARISON:  07/22/2011; 07/20/2011 FINDINGS: Grossly unchanged cardiac silhouette and mediastinal contours with atherosclerotic plaque within the thoracic aorta. Veiling  opacities overlying the bilateral lower lungs are favored to represent overlying breast tissues. No focal airspace opacities. No pleural effusion or pneumothorax. No evidence of edema. No acute osseus abnormalities. IMPRESSION: No acute cardiopulmonary disease on this AP portable examination. Electronically Signed   By: Sandi Mariscal M.D.   On: 11/03/2017 20:43    Micro Results    Recent Results (from the past 240 hour(s))  Urine culture     Status: Abnormal   Collection Time: 11/03/17 10:34 PM  Result Value Ref Range Status   Specimen Description   Final    URINE, RANDOM Performed at [facility], [address] [place], [place] [address]    Special Requests   Final    NONE Performed at [facility], [place] [place], [place] [address]    Culture MULTIPLE SPECIES PRESENT, SUGGEST RECOLLECTION (A)  Final   Report Status 11/05/2017 FINAL  Final  Culture, blood (routine x 2)     Status: Abnormal   Collection Time: 11/03/17 10:55 PM  Result Value Ref Range Status   Specimen Description   Final    BLOOD LEFT ANTECUBITAL Performed at [facility], [address], [place], [place] [address]    Special Requests   Final    BOTTLES DRAWN AEROBIC AND ANAEROBIC Blood  Culture adequate volume Performed at [facility], [address] [place], [place] [address]    Culture  Setup Time   Final    GRAM POSITIVE COCCI AEROBIC BOTTLE ONLY CRITICAL RESULT CALLED TO, READ BACK BY AND VERIFIED WITH: [place] AT 2318 11/04/17 ALV Performed at [facility] [address] [place], [place] [address]    Culture STAPHYLOCOCCUS SPECIES (COAGULASE NEGATIVE) (A)  Final   Report Status 11/07/2017 FINAL  Final   Organism ID, Bacteria STAPHYLOCOCCUS SPECIES (COAGULASE NEGATIVE)  Final      Susceptibility   Staphylococcus species (coagulase negative) - MIC*    CIPROFLOXACIN <=0.5 SENSITIVE Sensitive     ERYTHROMYCIN <=0.25 SENSITIVE Sensitive      GENTAMICIN <=0.5 SENSITIVE Sensitive     OXACILLIN <=0.25 SENSITIVE Sensitive     TETRACYCLINE <=1 SENSITIVE Sensitive     VANCOMYCIN 1 SENSITIVE Sensitive     TRIMETH/SULFA <=10 SENSITIVE Sensitive     CLINDAMYCIN <=0.25 SENSITIVE Sensitive     RIFAMPIN <=0.5 SENSITIVE Sensitive     Inducible Clindamycin NEGATIVE Sensitive     * STAPHYLOCOCCUS SPECIES (COAGULASE NEGATIVE)  Blood Culture ID Panel (Reflexed)     Status: Abnormal   Collection Time: 11/03/17 10:55 PM  Result Value Ref Range Status   Enterococcus species NOT DETECTED NOT DETECTED Final   Listeria monocytogenes NOT DETECTED NOT DETECTED Final   Staphylococcus species DETECTED (A) NOT DETECTED Final    Comment: Methicillin (oxacillin) susceptible coagulase negative staphylococcus. Possible blood culture contaminant (unless isolated from more than one blood culture draw or clinical case suggests pathogenicity). No antibiotic treatment is indicated for blood  culture contaminants. CRITICAL RESULT CALLED TO, READ BACK BY AND VERIFIED WITH: DAVID BESANTI AT 2318 11/04/17 ALV    Staphylococcus aureus NOT DETECTED NOT DETECTED Final   Methicillin resistance NOT DETECTED NOT DETECTED Final   Streptococcus species NOT DETECTED NOT DETECTED Final   Streptococcus agalactiae NOT DETECTED NOT DETECTED Final   Streptococcus pneumoniae NOT DETECTED NOT DETECTED Final   Streptococcus pyogenes NOT DETECTED NOT DETECTED Final   Acinetobacter baumannii NOT DETECTED NOT DETECTED Final   Enterobacteriaceae species NOT DETECTED NOT DETECTED Final   Enterobacter cloacae complex NOT DETECTED NOT DETECTED Final   Escherichia coli NOT DETECTED NOT DETECTED Final   Klebsiella oxytoca NOT DETECTED NOT DETECTED Final   Klebsiella pneumoniae NOT DETECTED NOT DETECTED Final   Proteus species NOT DETECTED NOT DETECTED Final   Serratia marcescens NOT DETECTED NOT DETECTED Final   Haemophilus influenzae NOT DETECTED NOT DETECTED Final   Neisseria  meningitidis NOT DETECTED NOT DETECTED Final   Pseudomonas aeruginosa NOT DETECTED NOT DETECTED Final   Candida albicans NOT DETECTED NOT DETECTED Final   Candida glabrata NOT DETECTED NOT DETECTED Final   Candida krusei NOT DETECTED NOT DETECTED Final   Candida parapsilosis NOT DETECTED NOT DETECTED Final   Candida tropicalis NOT DETECTED NOT DETECTED Final    Comment: Performed at [facility], [address] [place], [place] [address]  Culture, blood (routine x 2)     Status: Abnormal   Collection Time: 11/03/17 11:05 PM  Result Value Ref Range Status   Specimen Description   Final    BLOOD RIGHT ANTECUBITAL Performed at [facility], [address], [place], [place] [address]    Special Requests   Final    BOTTLES DRAWN AEROBIC AND ANAEROBIC Blood Culture adequate volume Performed at [facility], [place], [place]  [address]    Culture  Setup Time   Final    GRAM POSITIVE COCCI IN BOTH AEROBIC AND ANAEROBIC BOTTLES CRITICAL VALUE NOTED.  VALUE IS CONSISTENT WITH PREVIOUSLY REPORTED AND CALLED VALUE.    Culture (A)  Final    STAPHYLOCOCCUS SPECIES (COAGULASE NEGATIVE) SUSCEPTIBILITIES PERFORMED ON PREVIOUS CULTURE WITHIN THE LAST 5 DAYS. Performed at [facility], [place] [address] [place], [place] [address]    Report Status 11/08/2017 FINAL  Final  CULTURE, BLOOD (ROUTINE X 2) w Reflex to ID Panel     Status: None   Collection Time: 11/05/17 10:09 AM  Result Value Ref Range Status   Specimen Description BLOOD RIGHT [facility]  Final   Special Requests   Final    BOTTLES DRAWN AEROBIC AND ANAEROBIC Blood Culture adequate volume   Culture   Final    NO GROWTH 5 DAYS Performed at [facility], [address] [place], [place] [address]    Report Status 11/10/2017 FINAL  Final  CULTURE, BLOOD (ROUTINE X 2) w Reflex to ID Panel     Status: None   Collection Time: 11/05/17 10:09 AM  Result Value Ref Range Status   Specimen  Description BLOOD LEFT ARM  Final   Special Requests   Final    BOTTLES DRAWN AEROBIC AND ANAEROBIC Blood Culture results may not be optimal due to an excessive volume of blood received in culture bottles   Culture   Final    NO GROWTH 5 DAYS Performed at [facility], [address] [place], [place] [address]    Report Status 11/10/2017 FINAL  Final  Urine Culture     Status: None   Collection Time: 11/05/17  6:20 PM  Result Value Ref Range Status   Specimen Description   Final    URINE, RANDOM Performed at [facility], [address], [place], [place] [address]    Special Requests   Final    NONE Performed at [facility], [address], [place], [place] [address]    Culture   Final    NO GROWTH Performed at [facility], [place] [address] [place], [place] [address]    Report Status 11/07/2017 FINAL  Final       Today   Subjective:   Heidi Hanna today is stable for discharge to skilled nursing.  I called patient's daughter over the phone and explained.  Objective:   Blood pressure (!) 147/44, pulse 63, temperature 98 F (36.7 C), temperature source Oral, resp. rate 16, height 5\' 3"  (1.6 m), weight 104.3 kg (230 lb), SpO2 94 %.   Intake/Output Summary (Last 24 hours) at 11/10/2017 1046 Last data filed at 11/10/2017 0900 Gross per 24 hour  Intake 340 ml  Output -  Net 340 ml    Exam Baseline dementia. .AT,PERRAL Supple Neck,No JVD, No cervical lymphadenopathy appriciated.  Symmetrical Chest wall movement, Good air movement bilaterally, CTAB RRR,No Gallops,Rubs or new Murmurs, No Parasternal Heave +ve B.Sounds, Abd Soft, Non tender, No organomegaly appriciated, No rebound -guarding or rigidity. No Cyanosis, Clubbing or edema, No new Rash or bruise  Data Review   CBC w Diff:  Lab Results  Component Value Date   WBC 14.5 (H) 11/10/2017   HGB 11.7 (L) 11/10/2017   HCT 35.5 11/10/2017   PLT 391 11/10/2017   LYMPHOPCT 19  11/03/2017   MONOPCT 8 11/03/2017   EOSPCT 1 11/03/2017   BASOPCT 1 11/03/2017  CMP:  Lab Results  Component Value Date   NA 139 11/04/2017   K 3.3 (L) 11/04/2017   CL 103 11/04/2017   CO2 27 11/04/2017   BUN 16 11/04/2017   CREATININE 0.77 11/10/2017   PROT 6.5 11/03/2017   ALBUMIN 3.0 (L) 11/03/2017   BILITOT 0.6 11/03/2017   ALKPHOS 57 11/03/2017   AST 22 11/03/2017   ALT 11 (L) 11/03/2017  .   Total Time in preparing paper work, data evaluation and todays exam - 35 minutes  Epifanio Lesches M.D on 11/10/2017 at 10:46 AM    Note: This dictation was prepared with Dragon dictation along with smaller phrase technology. Any transcriptional errors that result from this process are unintentional.

## 2017-11-10 NOTE — Progress Notes (Signed)
Report called and given to Kim at [employer]. EMS called for transport. IV removed. Will get pt dressed and ready to be discharged to [facility].

## 2017-11-10 NOTE — Progress Notes (Signed)
Patient is medically stable for D/C to [facility] today. Per [facility] authorization has been received and patient can come today to room 803. RN will call report to RN Yaakov Guthrie at [phone] and arrange EMS for transport. Clinical Education officer, museum (CSW) sent D/C orders to [facility] via [facility]. CSW contacted patient's daughter Nira Conn and made her aware of above. Please reconsult if future social work needs arise. CSW signing off.   [employer], LCSW [phone]

## 2017-11-10 NOTE — Progress Notes (Signed)
[facility] [place] NAME: Heidi Hanna    MR#:  [id number]  DATE OF BIRTH:  1933/05/12  SUBJECTIVE: Seen at bedside, patient very demented at baseline.  CHIEF COMPLAINT:   Chief Complaint  Patient presents with  . Altered Mental Status  . Back Pain  Patient without complaint, continue chronic confusion/disorientation, awaiting placement, pe  REVIEW OF SYSTEMS:  Review of Systems  Unable to perform ROS: Dementia    DRUG ALLERGIES:   Allergies  Allergen Reactions  . Shellfish Allergy Nausea And Vomiting  . Penicillins Rash and Other (See Comments)    Has patient had a PCN reaction causing immediate rash, facial/tongue/throat swelling, SOB or lightheadedness with hypotension: No Has patient had a PCN reaction causing severe rash involving mucus membranes or skin necrosis: No Has patient had a PCN reaction that required hospitalization: No Has patient had a PCN reaction occurring within the last 10 years: No If all of the above answers are "NO", then may proceed with Cephalosporin use.     VITALS:  Blood pressure 132/63, pulse 64, temperature 98 F (36.7 C), temperature source Oral, resp. rate 16, height 5\' 3"  (1.6 m), weight 104.3 kg (230 lb), SpO2 94 %.  PHYSICAL EXAMINATION:  GENERAL:  84 y.o.-year-old patient lying in the bed with no acute distress. EYES: Pupils equal, round, reactive to light.No scleral icterus. Extraocular muscles intact.  HEENT: Head atraumatic, normocephalic. Oropharynx and nasopharynx clear.  NECK:  Supple, no jugular venous distention. No thyroid enlargement, no tenderness.  LUNGS: Normal breath sounds bilaterally, no wheezing, rales,rhonchi or crepitation. No use of accessory muscles of respiration.  CARDIOVASCULAR: S1, S2 normal. No murmurs, rubs, or gallops.  ABDOMEN: Soft, nontender, nondistended. Bowel sounds present. No organomegaly or mass.  EXTREMITIES: No pedal edema, cyanosis, or clubbing.   NEUROLOGIC: Unable to do full neurological exam because of dementia. PSYCHIATRIC: Not oriented to place and time because of dementia. SKIN: No obvious rash, lesion, or ulcer.   Physical Exam LABORATORY PANEL:   CBC Recent Labs  Lab 11/10/17 0355  WBC 14.5*  HGB 11.7*  HCT 35.5  PLT 391   ------------------------------------------------------------------------------------------------------------------  Chemistries  Recent Labs  Lab 11/03/17 1649 11/04/17 0332  11/10/17 0355  NA 143 139  --   --   K 4.2 3.3*  --   --   CL 107 103  --   --   CO2 26 27  --   --   GLUCOSE 124* 72  --   --   BUN 21* 16  --   --   CREATININE 0.78 0.68   < > 0.77  CALCIUM 9.2 8.5*  --   --   AST 22  --   --   --   ALT 11*  --   --   --   ALKPHOS 57  --   --   --   BILITOT 0.6  --   --   --    < > = values in this interval not displayed.   ------------------------------------------------------------------------------------------------------------------  Cardiac Enzymes Recent Labs  Lab 11/03/17 1649  TROPONINI 0.09*   ------------------------------------------------------------------------------------------------------------------  RADIOLOGY:  Ct Head Wo Contrast  Result Date: 11/08/2017 CLINICAL DATA:  Altered level of consciousness. EXAM: CT HEAD WITHOUT CONTRAST TECHNIQUE: Contiguous axial images were obtained from the base of the skull through the vertex without intravenous contrast. COMPARISON:  CT head 11/03/2017 FINDINGS: Brain: Moderate atrophy. Negative for hydrocephalus. Chronic microvascular ischemic changes throughout  the white matter are stable. Negative for acute infarct.  Negative for hemorrhage or mass Vascular: Atherosclerotic calcification in the carotid siphon bilaterally. Negative for acute thrombosis. Skull: Negative Sinuses/Orbits: Negative Other: None IMPRESSION: Atrophy and chronic microvascular ischemia. No acute abnormality and no change from 5 days prior  Electronically Signed   By: Franchot Gallo M.D.   On: 11/08/2017 13:36    ASSESSMENT AND PLAN:  1acute probable urinary tract infection Resolved Treated with course of Rocephin while in house Urine culture negative  2acute on chronic extensive skin candidiasis/panniculitis Resolving ContinueDiflucan for 7-day courseandtopical nystatin.she  is on IV Diflucan  Can be changed to p.o. Diflucan when ready for discharge.  3chronic diabetes mellitus type 2: Controlled. Stable on current regimen  4chronic benign essential hypertension Stable Continuehome meds  5chronic hyperlipidemia, unspecified Stable on statin therapy  6chronic GERD without esophagitis PPI daily  7acute confusion Resolved Confused/disoriented at baseline representing vascular dementia given history of CVA Continue increase nursing care as needed, aspiration/fall precautions, skin care precautions  8acute abnormal blood cultures 2 out of 2 bottles positive for coag negative staphconsistent with contamination, repeat blood cultures negative,echocardiogram was normal  9 ?acute facial asymmetry No new motor deficits, daughter concerned for facial asymmetry, acute CVA doubted CT head noted for atrophy, aspirin daily,   Can be discharged and the arrangements are made to skilled nursing facility.   All the records are reviewed and case discussed with Care Management/Social Workerr. Management plans discussed with the patient, family and they are in agreement.  CODE STATUS: full  TOTAL TIME TAKING CARE OF THIS PATIENT: 35 minutes.     POSSIBLE D/C IN 1-2 DAYS, DEPENDING ON CLINICAL CONDITION.   Epifanio Lesches M.D on 11/10/2017   Between 7am to 6pm - Pager - [phone]  After 6pm go to [email or web address] - password [email or web address] [facility]  Office  [phone]  CC: Primary care physician; System, Pcp Not In  Note: This dictation was prepared with Dragon  dictation along with smaller phrase technology. Any transcriptional errors that result from this process are unintentional.

## 2017-11-10 NOTE — Clinical Social Work Placement (Signed)
   CLINICAL SOCIAL WORK PLACEMENT  NOTE  Date:  11/10/2017  Patient Details  Name: Heidi Hanna MRN: [id number] Date of Birth: 01-02-1933  Clinical Social Work is seeking post-discharge placement for this patient at the [place] level of care (*CSW will initial, date and re-position this form in  chart as items are completed):  Yes   Patient/family provided with [facility] Work Department's list of facilities offering this level of care within the geographic area requested by the patient (or if unable, by the patient's family).  Yes   Patient/family informed of their freedom to choose among providers that offer the needed level of care, that participate in Medicare, Medicaid or managed care program needed by the patient, have an available bed and are willing to accept the patient.  Yes   Patient/family informed of [facility]'s ownership interest in [facility] and [facility], as well as of the fact that they are under no obligation to receive care at these facilities.  PASRR submitted to EDS on       PASRR number received on       Existing PASRR number confirmed on 11/05/17     FL2 transmitted to all facilities in geographic area requested by pt/family on 11/08/17     FL2 transmitted to all facilities within larger geographic area on       Patient informed that his/her managed care company has contracts with or will negotiate with certain facilities, including the following:        Yes   Patient/family informed of bed offers received.  [facility]     Physician recommends and patient chooses bed at      [facility] to be [facility] to [facility] on 11/10/17.  Patient to be transferred to facility by [facility] )     Patient family notified on 11/10/17 of transfer.  Name of family member notified:  (Patient's daughter Nira Conn is aware of D/C today. )     PHYSICIAN       Additional Comment:     _______________________________________________ Sharonica Kraszewski, Veronia Beets, LCSW 11/10/2017, 12:04 PM

## 2017-11-13 ENCOUNTER — Other Ambulatory Visit: Payer: Self-pay

## 2017-11-13 ENCOUNTER — Emergency Department: Payer: Medicare Other

## 2017-11-13 ENCOUNTER — Emergency Department
Admission: EM | Admit: 2017-11-13 | Discharge: 2017-11-13 | Disposition: A | Discharge status: Home / Self Care | Payer: Medicare Other | Attending: Student in an Organized Health Care Education/Training Program | Admitting: Student in an Organized Health Care Education/Training Program

## 2017-11-13 ENCOUNTER — Encounter: Payer: Self-pay | Admitting: *Deleted

## 2017-11-13 DIAGNOSIS — Z79899 Other long term (current) drug therapy: Secondary | ICD-10-CM | POA: Insufficient documentation

## 2017-11-13 DIAGNOSIS — I10 Essential (primary) hypertension: Secondary | ICD-10-CM | POA: Diagnosis not present

## 2017-11-13 DIAGNOSIS — S0990XA Unspecified injury of head, initial encounter: Secondary | ICD-10-CM

## 2017-11-13 DIAGNOSIS — Y998 Other external cause status: Secondary | ICD-10-CM | POA: Insufficient documentation

## 2017-11-13 DIAGNOSIS — E119 Type 2 diabetes mellitus without complications: Secondary | ICD-10-CM | POA: Insufficient documentation

## 2017-11-13 DIAGNOSIS — J45909 Unspecified asthma, uncomplicated: Secondary | ICD-10-CM | POA: Insufficient documentation

## 2017-11-13 DIAGNOSIS — S0003XA Contusion of scalp, initial encounter: Secondary | ICD-10-CM | POA: Diagnosis not present

## 2017-11-13 DIAGNOSIS — Z794 Long term (current) use of insulin: Secondary | ICD-10-CM | POA: Diagnosis not present

## 2017-11-13 DIAGNOSIS — Y9389 Activity, other specified: Secondary | ICD-10-CM | POA: Insufficient documentation

## 2017-11-13 DIAGNOSIS — W06XXXA Fall from bed, initial encounter: Secondary | ICD-10-CM | POA: Insufficient documentation

## 2017-11-13 DIAGNOSIS — Z7982 Long term (current) use of aspirin: Secondary | ICD-10-CM | POA: Insufficient documentation

## 2017-11-13 DIAGNOSIS — S0181XA Laceration without foreign body of other part of head, initial encounter: Secondary | ICD-10-CM | POA: Insufficient documentation

## 2017-11-13 DIAGNOSIS — Y929 Unspecified place or not applicable: Secondary | ICD-10-CM | POA: Insufficient documentation

## 2017-11-13 LAB — COMPREHENSIVE METABOLIC PANEL
ALT: 34 U/L (ref 14–54)
AST: 24 U/L (ref 15–41)
Albumin: 2.9 g/dL — ABNORMAL LOW (ref 3.5–5.0)
Alkaline Phosphatase: 64 U/L (ref 38–126)
Anion gap: 6 (ref 5–15)
BUN: 36 mg/dL — AB (ref 6–20)
CHLORIDE: 106 mmol/L (ref 101–111)
CO2: 26 mmol/L (ref 22–32)
CREATININE: 0.93 mg/dL (ref 0.44–1.00)
Calcium: 8.8 mg/dL — ABNORMAL LOW (ref 8.9–10.3)
GFR calc Af Amer: >60 mL/min (ref >60)
GFR calc non Af Amer: 56 mL/min — ABNORMAL LOW (ref >60)
Glucose, Bld: 215 mg/dL — ABNORMAL HIGH (ref 65–99)
POTASSIUM: 4.5 mmol/L (ref 3.5–5.1)
SODIUM: 138 mmol/L (ref 135–145)
Total Bilirubin: 0.6 mg/dL (ref 0.3–1.2)
Total Protein: 6.8 g/dL (ref 6.5–8.1)

## 2017-11-13 LAB — CBC WITH DIFFERENTIAL/PLATELET
BASOS ABS: 0.1 10*3/uL (ref 0–0.1)
BASOS PCT: 1 %
EOS ABS: 0.2 10*3/uL (ref 0–0.7)
Eosinophils Relative: 2 %
HCT: 37.5 % (ref 35.0–47.0)
Hemoglobin: 12.3 g/dL (ref 12.0–16.0)
Lymphocytes Relative: 17 %
Lymphs Abs: 2.4 10*3/uL (ref 1.0–3.6)
MCH: 29.3 pg (ref 26.0–34.0)
MCHC: 32.8 g/dL (ref 32.0–36.0)
MCV: 89.5 fL (ref 80.0–100.0)
MONO ABS: 1.2 10*3/uL — AB (ref 0.2–0.9)
Monocytes Relative: 8 %
Neutro Abs: 10.4 10*3/uL — ABNORMAL HIGH (ref 1.4–6.5)
Neutrophils Relative %: 72 %
PLATELETS: 466 10*3/uL — AB (ref 150–440)
RBC: 4.19 MIL/uL (ref 3.80–5.20)
RDW: 15.1 % — AB (ref 11.5–14.5)
WBC: 14.2 10*3/uL — AB (ref 3.6–11.0)

## 2017-11-13 LAB — GLUCOSE, CAPILLARY: GLUCOSE-CAPILLARY: 171 mg/dL — AB (ref 65–99)

## 2017-11-13 MED ORDER — TETANUS-DIPHTH-ACELL PERTUSSIS 5-2.5-18.5 LF-MCG/0.5 IM SUSP
0.5000 mL | Freq: Once | INTRAMUSCULAR | Status: AC
  Administered 2017-11-13: 0.5 mL via INTRAMUSCULAR
  Filled 2017-11-13: qty 0.5

## 2017-11-13 MED ORDER — LIDOCAINE-EPINEPHRINE-TETRACAINE (LET) SOLUTION
3.0000 mL | Freq: Once | NASAL | Status: AC
  Administered 2017-11-13: 19:00:00 3 mL via TOPICAL
  Filled 2017-11-13: qty 3

## 2017-11-13 NOTE — ED Triage Notes (Signed)
Pt has MOST form, report per MES, pt had unwitnessed fall, down time aproximated at 20 mins prior to EMS arrival, fell approximately one hr ago. No EKG, CBG, OR C-COLLAR from EMS. Pt is normally non-ambulatory w/o max assist. Pt is reported to have chronic back pain. Pt is A & O x 2, person and place, slightly confused regarding day and year.

## 2017-11-13 NOTE — ED Provider Notes (Addendum)
[facility] Emergency Department Provider Note    First MD Initiated Contact with Patient 11/13/17 1700     (approximate)  I have reviewed the triage vital signs and the nursing notes.   HISTORY  Chief Complaint Fall  HPI Heidi Hanna is a 84 y.o. female [facility] after mechanical fall that occurred roughly 45 minutes prior to arrival.  Patient is reportedly there after being recent admission the hospital for altered mental status and UTI.  Patient is on a ground level bed and frequently forgets that she is still too weak to get up or ambulate without assistive device.  She try to get out of bed and fell onto the floor and hit her forehead.  Denies any other pain.  States she was trying to get out of the bed because she is been dealing with worsening back pain which is not new for her.  No numbness or tingling.  Not reportedly on any blood thinners.  Past Medical History:  Diagnosis Date  . Asthma   . Cancer ([place])   . Collagen vascular disease ([place])   . Diabetes mellitus without complication ([place])   . GERD (gastroesophageal reflux disease)   . HLD (hyperlipidemia)   . Hypertension    Family History  Problem Relation Age of Onset  . Diabetes Mother   . Heart failure Mother   . Blindness Father    Past Surgical History:  Procedure Laterality Date  . CHOLECYSTECTOMY     Patient Active Problem List   Diagnosis Date Noted  . UTI (urinary tract infection) 11/04/2017  . Diabetes ([place]) 11/04/2017  . HTN (hypertension) 11/04/2017  . HLD (hyperlipidemia) 11/04/2017  . GERD (gastroesophageal reflux disease) 11/04/2017  . Candida-induced panniculitis 11/04/2017      Prior to Admission medications   Medication Sig Start Date End Date Taking? Authorizing Provider  amLODipine (NORVASC) 10 MG tablet Take 10 mg by mouth daily. 10/22/17   [provider]  Ascorbic Acid 1000 MG TBCR Take 1,000 mg by mouth daily.    [provider]  aspirin EC 325 MG EC tablet Take 1 tablet (325 mg total) by mouth daily. 11/11/17   Epifanio Lesches, MD  Cholecalciferol (VITAMIN D-3) 5000 units TABS Take 5,000 Units by mouth every 3 (three) days.    [provider]  fluconazole (DIFLUCAN) 100 MG tablet Take 1 tablet (100 mg total) by mouth daily. 11/10/17 12/10/17  Epifanio Lesches, MD  gabapentin (NEURONTIN) 100 MG capsule Take 100 mg by mouth 2 (two) times daily. 09/12/17   [provider]  guaiFENesin (ROBITUSSIN) 100 MG/5ML liquid Take 200 mg by mouth 3 (three) times daily as needed for cough.    [provider]  hydrALAZINE (APRESOLINE) 25 MG tablet Take 25 mg by mouth 2 (two) times daily. 10/27/17   [provider]  insulin aspart (NOVOLOG) 100 UNIT/ML injection Inject 5 Units into the skin 3 (three) times daily with meals. 11/10/17   Epifanio Lesches, MD  insulin aspart (NOVOLOG) 100 UNIT/ML injection Inject 0-9 Units into the skin 3 (three) times daily before meals. 11/10/17   Epifanio Lesches, MD  insulin glargine (LANTUS) 100 UNIT/ML injection Inject 0.1 mLs (10 Units total) into the skin 2 (two) times daily. 11/10/17   Epifanio Lesches, MD  LORazepam (ATIVAN) 0.5 MG tablet Take 1-2 tablets (0.5-1 mg total) by mouth 2 (two) times daily. 0.5 mg in the morning and 1 mg at bedtime 11/10/17   Epifanio Lesches,  MD  losartan (COZAAR) 100 MG tablet Take 100 mg by mouth daily. 09/16/17   [provider]  megestrol (MEGACE) 400 MG/10ML suspension Take 10 mLs (400 mg total) by mouth 2 (two) times daily. 11/10/17   Epifanio Lesches, MD  metoprolol (TOPROL-XL) 200 MG 24 hr tablet Take 200 mg by mouth daily. 10/08/17   [provider]  Multiple Vitamin (MULTIVITAMIN WITH MINERALS) TABS tablet Take 1 tablet by mouth daily. 11/11/17   Epifanio Lesches, MD  nystatin (MYCOSTATIN/NYSTOP) powder Apply topically 3 (three) times daily. 11/10/17   Epifanio Lesches,  MD  omeprazole (PRILOSEC) 20 MG capsule Take 20 mg by mouth daily. 08/15/17   [provider]  pravastatin (PRAVACHOL) 80 MG tablet Take 80 mg by mouth daily. 08/05/17   [provider]  protein supplement shake (PREMIER PROTEIN) LIQD Take 325 mLs (11 oz total) by mouth 2 (two) times daily between meals. 11/10/17   Epifanio Lesches, MD  senna-docusate (SENOKOT-S) 8.6-50 MG tablet Take 2 tablets by mouth at bedtime. 11/10/17   Epifanio Lesches, MD    Allergies Shellfish allergy and Penicillins    Social History Social History   Tobacco Use  . Smoking status: Never Smoker  . Smokeless tobacco: Never Used  Substance Use Topics  . Alcohol use: No    Frequency: Never  . Drug use: No    Review of Systems Patient denies headaches, rhinorrhea, blurry vision, numbness, shortness of breath, chest pain, edema, cough, abdominal pain, nausea, vomiting, diarrhea, dysuria, fevers, rashesunless otherwise stated above in HPI. ____________________________________________   PHYSICAL EXAM:  VITAL SIGNS: Vitals:   11/13/17 1701  SpO2: 96%    Constitutional: chronically ill appearing and in no acute distress. Eyes: Conjunctivae are normal.  Head: 3 cm linear laceration in the middle of the forehead. Nose: No congestion/rhinnorhea. Mouth/Throat: Mucous membranes are moist.   Neck: No stridor. Painless ROM.  Cardiovascular: Normal rate, regular rhythm. Grossly normal heart sounds.  Good peripheral circulation. Respiratory: Normal respiratory effort.  No retractions. Lungs CTAB. Gastrointestinal: Soft and nontender. No distention. No abdominal bruits. No CVA tenderness. Genitourinary: deferred Musculoskeletal: No lower extremity tenderness nor edema.  No joint effusions. Neurologic:  Normal speech and language. No gross focal neurologic deficits are appreciated. No facial droop Skin:  Skin is warm, dry and intact. No rash noted.  See above  Psychiatric: Mood and  affect are normal. Speech and behavior are normal.  ____________________________________________   LABS (all labs ordered are listed, but only abnormal results are displayed)  Results for orders placed or performed during the hospital encounter of 11/13/17 (from the past 24 hour(s))  Glucose, capillary     Status: Abnormal   Collection Time: 11/13/17  5:48 PM  Result Value Ref Range   Glucose-Capillary 171 (H) 65 - 99 mg/dL  CBC with Differential/Platelet     Status: Abnormal   Collection Time: 11/13/17  6:30 PM  Result Value Ref Range   WBC 14.2 (H) 3.6 - 11.0 K/uL   RBC 4.19 3.80 - 5.20 MIL/uL   Hemoglobin 12.3 12.0 - 16.0 g/dL   HCT 37.5 35.0 - 47.0 %   MCV 89.5 80.0 - 100.0 fL   MCH 29.3 26.0 - 34.0 pg   MCHC 32.8 32.0 - 36.0 g/dL   RDW 15.1 (H) 11.5 - 14.5 %   Platelets 466 (H) 150 - 440 K/uL   Neutrophils Relative % 72 %   Neutro Abs 10.4 (H) 1.4 - 6.5 K/uL   Lymphocytes Relative 17 %  Lymphs Abs 2.4 1.0 - 3.6 K/uL   Monocytes Relative 8 %   Monocytes Absolute 1.2 (H) 0.2 - 0.9 K/uL   Eosinophils Relative 2 %   Eosinophils Absolute 0.2 0 - 0.7 K/uL   Basophils Relative 1 %   Basophils Absolute 0.1 0 - 0.1 K/uL  Comprehensive metabolic panel     Status: Abnormal   Collection Time: 11/13/17  6:30 PM  Result Value Ref Range   Sodium 138 135 - 145 mmol/L   Potassium 4.5 3.5 - 5.1 mmol/L   Chloride 106 101 - 111 mmol/L   CO2 26 22 - 32 mmol/L   Glucose, Bld 215 (H) 65 - 99 mg/dL   BUN 36 (H) 6 - 20 mg/dL   Creatinine, Ser 0.93 0.44 - 1.00 mg/dL   Calcium 8.8 (L) 8.9 - 10.3 mg/dL   Total Protein 6.8 6.5 - 8.1 g/dL   Albumin 2.9 (L) 3.5 - 5.0 g/dL   AST 24 15 - 41 U/L   ALT 34 14 - 54 U/L   Alkaline Phosphatase 64 38 - 126 U/L   Total Bilirubin 0.6 0.3 - 1.2 mg/dL   GFR calc non Af Amer 56 (L) >60 mL/min   GFR calc Af Amer >60 >60 mL/min   Anion gap 6 5 - 15   ____________________________________________  EKG:  ED ECG REPORT I, Merlyn Lot, the  attending physician, personally viewed and interpreted this ECG.   Date: 11/13/2017  EKG Time: 17:19  Rate: 60  Rhythm: normal EKG, normal sinus rhythm, unchanged from previous tracings  Axis: normal  Intervals: normal intervals  ST&T Change: no stemi  ____________________________________________  RADIOLOGY  I personally reviewed all radiographic images ordered to evaluate for the above acute complaints and reviewed radiology reports and findings.  These findings were personally discussed with the patient.  Please see medical record for radiology report.  ____________________________________________   PROCEDURES  Procedure(s) performed:  Marland KitchenMarland KitchenLaceration Repair Date/Time: 11/13/2017 7:13 PM Performed by: Merlyn Lot, MD Authorized by: Merlyn Lot, MD   Consent:    Consent obtained:  Verbal   Consent given by:  Patient   Risks discussed:  Infection, pain, retained foreign body, poor cosmetic result and poor wound healing Anesthesia (see MAR for exact dosages):    Anesthesia method:  Topical application   Topical anesthetic:  LET Laceration details:    Location:  Face   Face location:  Forehead   Length (cm):  3   Depth (mm):  3 Repair type:    Repair type:  Simple Exploration:    Hemostasis achieved with:  Direct pressure   Wound exploration: entire depth of wound probed and visualized     Contaminated: no   Treatment:    Area cleansed with:  Saline and Betadine   Amount of cleaning:  Standard   Irrigation solution:  Sterile saline   Visualized foreign bodies/material removed: no   Skin repair:    Repair method:  Sutures   Suture size:  5-0   Suture material:  Nylon   Number of sutures:  6 Approximation:    Approximation:  Close Post-procedure details:    Dressing:  Sterile dressing   Patient tolerance of procedure:  Tolerated well, no immediate complications      Critical Care performed:  no ____________________________________________   INITIAL IMPRESSION / ASSESSMENT AND PLAN / ED COURSE  Pertinent labs & imaging results that were available during my care of the patient were reviewed by me and considered in  my medical decision making (see chart for details).  DDX: laceration, contusion, sdh, iph, sah, concussion  Heidi Hanna is a 84 y.o. who presents to the ED with mechanical fall as described above with facial laceration.  CT imaging and blood work sent for the above differential which is reassuring.  No evidence of acute intracranial abnormality.  Laceration repaired as described above.  Patient's not septic and blood work is at baseline.  Patient stable and appropriate for discharge back to rehab facility.        ____________________________________________   FINAL CLINICAL IMPRESSION(S) / ED DIAGNOSES  Final diagnoses:  Injury of head, initial encounter  Facial laceration, initial encounter      NEW MEDICATIONS STARTED DURING THIS VISIT:  New Prescriptions   No medications on file     Note:  This document was prepared using Dragon voice recognition software and may include unintentional dictation errors.    Merlyn Lot, MD 11/13/17 Felicita Gage    Merlyn Lot, MD 11/13/17 2011

## 2017-11-13 NOTE — ED Notes (Signed)
Talked to 3 people at [facility] and was told that she was in none of their units. Will attempt again to call report.

## 2017-11-15 DIAGNOSIS — Z794 Long term (current) use of insulin: Secondary | ICD-10-CM | POA: Diagnosis not present

## 2017-11-15 DIAGNOSIS — M793 Panniculitis, unspecified: Secondary | ICD-10-CM | POA: Diagnosis not present

## 2017-11-15 DIAGNOSIS — E119 Type 2 diabetes mellitus without complications: Secondary | ICD-10-CM | POA: Diagnosis not present

## 2017-11-15 DIAGNOSIS — N390 Urinary tract infection, site not specified: Secondary | ICD-10-CM | POA: Diagnosis not present

## 2017-11-20 DIAGNOSIS — M793 Panniculitis, unspecified: Secondary | ICD-10-CM | POA: Diagnosis not present

## 2017-11-20 DIAGNOSIS — E119 Type 2 diabetes mellitus without complications: Secondary | ICD-10-CM | POA: Diagnosis not present

## 2017-11-20 DIAGNOSIS — Z794 Long term (current) use of insulin: Secondary | ICD-10-CM | POA: Diagnosis not present

## 2017-11-20 DIAGNOSIS — N390 Urinary tract infection, site not specified: Secondary | ICD-10-CM | POA: Diagnosis not present

## 2017-11-26 DIAGNOSIS — R451 Restlessness and agitation: Secondary | ICD-10-CM | POA: Diagnosis not present

## 2017-11-26 DIAGNOSIS — N390 Urinary tract infection, site not specified: Secondary | ICD-10-CM | POA: Diagnosis not present

## 2017-11-26 DIAGNOSIS — Z794 Long term (current) use of insulin: Secondary | ICD-10-CM | POA: Diagnosis not present

## 2017-11-26 DIAGNOSIS — E119 Type 2 diabetes mellitus without complications: Secondary | ICD-10-CM | POA: Diagnosis not present

## 2017-12-02 DIAGNOSIS — I10 Essential (primary) hypertension: Secondary | ICD-10-CM | POA: Diagnosis not present

## 2017-12-02 DIAGNOSIS — L89310 Pressure ulcer of right buttock, unstageable: Secondary | ICD-10-CM | POA: Diagnosis not present

## 2017-12-02 DIAGNOSIS — L89312 Pressure ulcer of right buttock, stage 2: Secondary | ICD-10-CM | POA: Diagnosis not present

## 2017-12-02 DIAGNOSIS — E1121 Type 2 diabetes mellitus with diabetic nephropathy: Secondary | ICD-10-CM | POA: Diagnosis not present

## 2017-12-02 DIAGNOSIS — J45909 Unspecified asthma, uncomplicated: Secondary | ICD-10-CM | POA: Diagnosis not present

## 2017-12-03 DIAGNOSIS — L89312 Pressure ulcer of right buttock, stage 2: Secondary | ICD-10-CM | POA: Diagnosis not present

## 2017-12-03 DIAGNOSIS — L89310 Pressure ulcer of right buttock, unstageable: Secondary | ICD-10-CM | POA: Diagnosis not present

## 2017-12-03 DIAGNOSIS — J45909 Unspecified asthma, uncomplicated: Secondary | ICD-10-CM | POA: Diagnosis not present

## 2017-12-03 DIAGNOSIS — E1121 Type 2 diabetes mellitus with diabetic nephropathy: Secondary | ICD-10-CM | POA: Diagnosis not present

## 2017-12-03 DIAGNOSIS — I10 Essential (primary) hypertension: Secondary | ICD-10-CM | POA: Diagnosis not present

## 2017-12-05 DIAGNOSIS — L89310 Pressure ulcer of right buttock, unstageable: Secondary | ICD-10-CM | POA: Diagnosis not present

## 2017-12-05 DIAGNOSIS — I10 Essential (primary) hypertension: Secondary | ICD-10-CM | POA: Diagnosis not present

## 2017-12-05 DIAGNOSIS — L89312 Pressure ulcer of right buttock, stage 2: Secondary | ICD-10-CM | POA: Diagnosis not present

## 2017-12-05 DIAGNOSIS — E1121 Type 2 diabetes mellitus with diabetic nephropathy: Secondary | ICD-10-CM | POA: Diagnosis not present

## 2017-12-05 DIAGNOSIS — J45909 Unspecified asthma, uncomplicated: Secondary | ICD-10-CM | POA: Diagnosis not present

## 2017-12-09 DIAGNOSIS — L89312 Pressure ulcer of right buttock, stage 2: Secondary | ICD-10-CM | POA: Diagnosis not present

## 2017-12-09 DIAGNOSIS — I10 Essential (primary) hypertension: Secondary | ICD-10-CM | POA: Diagnosis not present

## 2017-12-09 DIAGNOSIS — E1121 Type 2 diabetes mellitus with diabetic nephropathy: Secondary | ICD-10-CM | POA: Diagnosis not present

## 2017-12-09 DIAGNOSIS — J45909 Unspecified asthma, uncomplicated: Secondary | ICD-10-CM | POA: Diagnosis not present

## 2017-12-09 DIAGNOSIS — L89310 Pressure ulcer of right buttock, unstageable: Secondary | ICD-10-CM | POA: Diagnosis not present

## 2017-12-10 DIAGNOSIS — E1121 Type 2 diabetes mellitus with diabetic nephropathy: Secondary | ICD-10-CM | POA: Diagnosis not present

## 2017-12-10 DIAGNOSIS — J45909 Unspecified asthma, uncomplicated: Secondary | ICD-10-CM | POA: Diagnosis not present

## 2017-12-10 DIAGNOSIS — L89312 Pressure ulcer of right buttock, stage 2: Secondary | ICD-10-CM | POA: Diagnosis not present

## 2017-12-10 DIAGNOSIS — I10 Essential (primary) hypertension: Secondary | ICD-10-CM | POA: Diagnosis not present

## 2017-12-10 DIAGNOSIS — L89310 Pressure ulcer of right buttock, unstageable: Secondary | ICD-10-CM | POA: Diagnosis not present

## 2017-12-11 DIAGNOSIS — E1121 Type 2 diabetes mellitus with diabetic nephropathy: Secondary | ICD-10-CM | POA: Diagnosis not present

## 2017-12-11 DIAGNOSIS — L89310 Pressure ulcer of right buttock, unstageable: Secondary | ICD-10-CM | POA: Diagnosis not present

## 2017-12-11 DIAGNOSIS — L89312 Pressure ulcer of right buttock, stage 2: Secondary | ICD-10-CM | POA: Diagnosis not present

## 2017-12-11 DIAGNOSIS — I10 Essential (primary) hypertension: Secondary | ICD-10-CM | POA: Diagnosis not present

## 2017-12-11 DIAGNOSIS — J45909 Unspecified asthma, uncomplicated: Secondary | ICD-10-CM | POA: Diagnosis not present

## 2017-12-12 DIAGNOSIS — I10 Essential (primary) hypertension: Secondary | ICD-10-CM | POA: Diagnosis not present

## 2017-12-12 DIAGNOSIS — E1121 Type 2 diabetes mellitus with diabetic nephropathy: Secondary | ICD-10-CM | POA: Diagnosis not present

## 2017-12-12 DIAGNOSIS — J45909 Unspecified asthma, uncomplicated: Secondary | ICD-10-CM | POA: Diagnosis not present

## 2017-12-12 DIAGNOSIS — L89310 Pressure ulcer of right buttock, unstageable: Secondary | ICD-10-CM | POA: Diagnosis not present

## 2017-12-12 DIAGNOSIS — L89312 Pressure ulcer of right buttock, stage 2: Secondary | ICD-10-CM | POA: Diagnosis not present

## 2017-12-17 DIAGNOSIS — I10 Essential (primary) hypertension: Secondary | ICD-10-CM | POA: Diagnosis not present

## 2017-12-17 DIAGNOSIS — J45909 Unspecified asthma, uncomplicated: Secondary | ICD-10-CM | POA: Diagnosis not present

## 2017-12-17 DIAGNOSIS — L89310 Pressure ulcer of right buttock, unstageable: Secondary | ICD-10-CM | POA: Diagnosis not present

## 2017-12-17 DIAGNOSIS — L89312 Pressure ulcer of right buttock, stage 2: Secondary | ICD-10-CM | POA: Diagnosis not present

## 2017-12-17 DIAGNOSIS — E1121 Type 2 diabetes mellitus with diabetic nephropathy: Secondary | ICD-10-CM | POA: Diagnosis not present

## 2017-12-18 DIAGNOSIS — L89312 Pressure ulcer of right buttock, stage 2: Secondary | ICD-10-CM | POA: Diagnosis not present

## 2017-12-18 DIAGNOSIS — E119 Type 2 diabetes mellitus without complications: Secondary | ICD-10-CM | POA: Diagnosis not present

## 2017-12-18 DIAGNOSIS — I10 Essential (primary) hypertension: Secondary | ICD-10-CM | POA: Diagnosis not present

## 2017-12-18 DIAGNOSIS — I2510 Atherosclerotic heart disease of native coronary artery without angina pectoris: Secondary | ICD-10-CM | POA: Diagnosis not present

## 2017-12-19 DIAGNOSIS — M25562 Pain in left knee: Secondary | ICD-10-CM | POA: Diagnosis not present

## 2017-12-19 DIAGNOSIS — L89312 Pressure ulcer of right buttock, stage 2: Secondary | ICD-10-CM | POA: Diagnosis not present

## 2017-12-19 DIAGNOSIS — E1121 Type 2 diabetes mellitus with diabetic nephropathy: Secondary | ICD-10-CM | POA: Diagnosis not present

## 2017-12-19 DIAGNOSIS — I10 Essential (primary) hypertension: Secondary | ICD-10-CM | POA: Diagnosis not present

## 2017-12-19 DIAGNOSIS — J45909 Unspecified asthma, uncomplicated: Secondary | ICD-10-CM | POA: Diagnosis not present

## 2017-12-19 DIAGNOSIS — L89310 Pressure ulcer of right buttock, unstageable: Secondary | ICD-10-CM | POA: Diagnosis not present

## 2017-12-20 DIAGNOSIS — J45909 Unspecified asthma, uncomplicated: Secondary | ICD-10-CM | POA: Diagnosis not present

## 2017-12-20 DIAGNOSIS — I10 Essential (primary) hypertension: Secondary | ICD-10-CM | POA: Diagnosis not present

## 2017-12-20 DIAGNOSIS — L89312 Pressure ulcer of right buttock, stage 2: Secondary | ICD-10-CM | POA: Diagnosis not present

## 2017-12-20 DIAGNOSIS — E1121 Type 2 diabetes mellitus with diabetic nephropathy: Secondary | ICD-10-CM | POA: Diagnosis not present

## 2017-12-20 DIAGNOSIS — L89310 Pressure ulcer of right buttock, unstageable: Secondary | ICD-10-CM | POA: Diagnosis not present

## 2017-12-23 DIAGNOSIS — I10 Essential (primary) hypertension: Secondary | ICD-10-CM | POA: Diagnosis not present

## 2017-12-23 DIAGNOSIS — L89312 Pressure ulcer of right buttock, stage 2: Secondary | ICD-10-CM | POA: Diagnosis not present

## 2017-12-23 DIAGNOSIS — E1121 Type 2 diabetes mellitus with diabetic nephropathy: Secondary | ICD-10-CM | POA: Diagnosis not present

## 2017-12-23 DIAGNOSIS — L89310 Pressure ulcer of right buttock, unstageable: Secondary | ICD-10-CM | POA: Diagnosis not present

## 2017-12-23 DIAGNOSIS — J45909 Unspecified asthma, uncomplicated: Secondary | ICD-10-CM | POA: Diagnosis not present

## 2017-12-24 DIAGNOSIS — L89310 Pressure ulcer of right buttock, unstageable: Secondary | ICD-10-CM | POA: Diagnosis not present

## 2017-12-24 DIAGNOSIS — J45909 Unspecified asthma, uncomplicated: Secondary | ICD-10-CM | POA: Diagnosis not present

## 2017-12-24 DIAGNOSIS — E1121 Type 2 diabetes mellitus with diabetic nephropathy: Secondary | ICD-10-CM | POA: Diagnosis not present

## 2017-12-24 DIAGNOSIS — I10 Essential (primary) hypertension: Secondary | ICD-10-CM | POA: Diagnosis not present

## 2017-12-24 DIAGNOSIS — L89312 Pressure ulcer of right buttock, stage 2: Secondary | ICD-10-CM | POA: Diagnosis not present

## 2017-12-26 DIAGNOSIS — E1121 Type 2 diabetes mellitus with diabetic nephropathy: Secondary | ICD-10-CM | POA: Diagnosis not present

## 2017-12-26 DIAGNOSIS — L89310 Pressure ulcer of right buttock, unstageable: Secondary | ICD-10-CM | POA: Diagnosis not present

## 2017-12-26 DIAGNOSIS — L89312 Pressure ulcer of right buttock, stage 2: Secondary | ICD-10-CM | POA: Diagnosis not present

## 2017-12-26 DIAGNOSIS — I10 Essential (primary) hypertension: Secondary | ICD-10-CM | POA: Diagnosis not present

## 2017-12-26 DIAGNOSIS — J45909 Unspecified asthma, uncomplicated: Secondary | ICD-10-CM | POA: Diagnosis not present

## 2017-12-28 DIAGNOSIS — R1312 Dysphagia, oropharyngeal phase: Secondary | ICD-10-CM | POA: Diagnosis not present

## 2018-01-02 DIAGNOSIS — J45909 Unspecified asthma, uncomplicated: Secondary | ICD-10-CM | POA: Diagnosis not present

## 2018-01-02 DIAGNOSIS — E1121 Type 2 diabetes mellitus with diabetic nephropathy: Secondary | ICD-10-CM | POA: Diagnosis not present

## 2018-01-02 DIAGNOSIS — L89312 Pressure ulcer of right buttock, stage 2: Secondary | ICD-10-CM | POA: Diagnosis not present

## 2018-01-02 DIAGNOSIS — L89310 Pressure ulcer of right buttock, unstageable: Secondary | ICD-10-CM | POA: Diagnosis not present

## 2018-01-02 DIAGNOSIS — I10 Essential (primary) hypertension: Secondary | ICD-10-CM | POA: Diagnosis not present

## 2018-01-06 DIAGNOSIS — I2510 Atherosclerotic heart disease of native coronary artery without angina pectoris: Secondary | ICD-10-CM | POA: Diagnosis not present

## 2018-01-06 DIAGNOSIS — E119 Type 2 diabetes mellitus without complications: Secondary | ICD-10-CM | POA: Diagnosis not present

## 2018-01-06 DIAGNOSIS — M1710 Unilateral primary osteoarthritis, unspecified knee: Secondary | ICD-10-CM | POA: Diagnosis not present

## 2018-01-06 DIAGNOSIS — I10 Essential (primary) hypertension: Secondary | ICD-10-CM | POA: Diagnosis not present

## 2018-01-08 DIAGNOSIS — I10 Essential (primary) hypertension: Secondary | ICD-10-CM | POA: Diagnosis not present

## 2018-01-08 DIAGNOSIS — J45909 Unspecified asthma, uncomplicated: Secondary | ICD-10-CM | POA: Diagnosis not present

## 2018-01-08 DIAGNOSIS — E1121 Type 2 diabetes mellitus with diabetic nephropathy: Secondary | ICD-10-CM | POA: Diagnosis not present

## 2018-01-08 DIAGNOSIS — L89310 Pressure ulcer of right buttock, unstageable: Secondary | ICD-10-CM | POA: Diagnosis not present

## 2018-01-08 DIAGNOSIS — L89312 Pressure ulcer of right buttock, stage 2: Secondary | ICD-10-CM | POA: Diagnosis not present

## 2018-01-09 DIAGNOSIS — L89312 Pressure ulcer of right buttock, stage 2: Secondary | ICD-10-CM | POA: Diagnosis not present

## 2018-01-12 DIAGNOSIS — L89310 Pressure ulcer of right buttock, unstageable: Secondary | ICD-10-CM | POA: Diagnosis not present

## 2018-01-12 DIAGNOSIS — L89312 Pressure ulcer of right buttock, stage 2: Secondary | ICD-10-CM | POA: Diagnosis not present

## 2018-01-12 DIAGNOSIS — J45909 Unspecified asthma, uncomplicated: Secondary | ICD-10-CM | POA: Diagnosis not present

## 2018-01-12 DIAGNOSIS — E1121 Type 2 diabetes mellitus with diabetic nephropathy: Secondary | ICD-10-CM | POA: Diagnosis not present

## 2018-01-12 DIAGNOSIS — I10 Essential (primary) hypertension: Secondary | ICD-10-CM | POA: Diagnosis not present

## 2018-01-14 DIAGNOSIS — I10 Essential (primary) hypertension: Secondary | ICD-10-CM | POA: Diagnosis not present

## 2018-01-14 DIAGNOSIS — J45909 Unspecified asthma, uncomplicated: Secondary | ICD-10-CM | POA: Diagnosis not present

## 2018-01-14 DIAGNOSIS — E1121 Type 2 diabetes mellitus with diabetic nephropathy: Secondary | ICD-10-CM | POA: Diagnosis not present

## 2018-01-14 DIAGNOSIS — L89312 Pressure ulcer of right buttock, stage 2: Secondary | ICD-10-CM | POA: Diagnosis not present

## 2018-01-14 DIAGNOSIS — L89310 Pressure ulcer of right buttock, unstageable: Secondary | ICD-10-CM | POA: Diagnosis not present

## 2018-01-15 DIAGNOSIS — I10 Essential (primary) hypertension: Secondary | ICD-10-CM | POA: Diagnosis not present

## 2018-01-15 DIAGNOSIS — E1121 Type 2 diabetes mellitus with diabetic nephropathy: Secondary | ICD-10-CM | POA: Diagnosis not present

## 2018-01-15 DIAGNOSIS — L89312 Pressure ulcer of right buttock, stage 2: Secondary | ICD-10-CM | POA: Diagnosis not present

## 2018-01-15 DIAGNOSIS — J45909 Unspecified asthma, uncomplicated: Secondary | ICD-10-CM | POA: Diagnosis not present

## 2018-01-15 DIAGNOSIS — L89310 Pressure ulcer of right buttock, unstageable: Secondary | ICD-10-CM | POA: Diagnosis not present

## 2018-01-17 DIAGNOSIS — L89310 Pressure ulcer of right buttock, unstageable: Secondary | ICD-10-CM | POA: Diagnosis not present

## 2018-01-17 DIAGNOSIS — J45909 Unspecified asthma, uncomplicated: Secondary | ICD-10-CM | POA: Diagnosis not present

## 2018-01-17 DIAGNOSIS — L89312 Pressure ulcer of right buttock, stage 2: Secondary | ICD-10-CM | POA: Diagnosis not present

## 2018-01-17 DIAGNOSIS — I10 Essential (primary) hypertension: Secondary | ICD-10-CM | POA: Diagnosis not present

## 2018-01-17 DIAGNOSIS — E1121 Type 2 diabetes mellitus with diabetic nephropathy: Secondary | ICD-10-CM | POA: Diagnosis not present

## 2018-01-21 DIAGNOSIS — L89312 Pressure ulcer of right buttock, stage 2: Secondary | ICD-10-CM | POA: Diagnosis not present

## 2018-01-21 DIAGNOSIS — L89310 Pressure ulcer of right buttock, unstageable: Secondary | ICD-10-CM | POA: Diagnosis not present

## 2018-01-21 DIAGNOSIS — J45909 Unspecified asthma, uncomplicated: Secondary | ICD-10-CM | POA: Diagnosis not present

## 2018-01-21 DIAGNOSIS — E1121 Type 2 diabetes mellitus with diabetic nephropathy: Secondary | ICD-10-CM | POA: Diagnosis not present

## 2018-01-21 DIAGNOSIS — I10 Essential (primary) hypertension: Secondary | ICD-10-CM | POA: Diagnosis not present

## 2018-01-22 DIAGNOSIS — L89310 Pressure ulcer of right buttock, unstageable: Secondary | ICD-10-CM | POA: Diagnosis not present

## 2018-01-22 DIAGNOSIS — L89312 Pressure ulcer of right buttock, stage 2: Secondary | ICD-10-CM | POA: Diagnosis not present

## 2018-01-22 DIAGNOSIS — I10 Essential (primary) hypertension: Secondary | ICD-10-CM | POA: Diagnosis not present

## 2018-01-22 DIAGNOSIS — J45909 Unspecified asthma, uncomplicated: Secondary | ICD-10-CM | POA: Diagnosis not present

## 2018-01-22 DIAGNOSIS — E1121 Type 2 diabetes mellitus with diabetic nephropathy: Secondary | ICD-10-CM | POA: Diagnosis not present

## 2018-01-22 DIAGNOSIS — Z87440 Personal history of urinary (tract) infections: Secondary | ICD-10-CM | POA: Diagnosis not present

## 2018-01-28 DIAGNOSIS — L89310 Pressure ulcer of right buttock, unstageable: Secondary | ICD-10-CM | POA: Diagnosis not present

## 2018-01-28 DIAGNOSIS — L89312 Pressure ulcer of right buttock, stage 2: Secondary | ICD-10-CM | POA: Diagnosis not present

## 2018-01-28 DIAGNOSIS — I10 Essential (primary) hypertension: Secondary | ICD-10-CM | POA: Diagnosis not present

## 2018-01-28 DIAGNOSIS — J45909 Unspecified asthma, uncomplicated: Secondary | ICD-10-CM | POA: Diagnosis not present

## 2018-01-28 DIAGNOSIS — R1312 Dysphagia, oropharyngeal phase: Secondary | ICD-10-CM | POA: Diagnosis not present

## 2018-01-28 DIAGNOSIS — E1121 Type 2 diabetes mellitus with diabetic nephropathy: Secondary | ICD-10-CM | POA: Diagnosis not present

## 2018-01-29 DIAGNOSIS — L89310 Pressure ulcer of right buttock, unstageable: Secondary | ICD-10-CM | POA: Diagnosis not present

## 2018-01-29 DIAGNOSIS — E1121 Type 2 diabetes mellitus with diabetic nephropathy: Secondary | ICD-10-CM | POA: Diagnosis not present

## 2018-01-29 DIAGNOSIS — L89312 Pressure ulcer of right buttock, stage 2: Secondary | ICD-10-CM | POA: Diagnosis not present

## 2018-01-29 DIAGNOSIS — J45909 Unspecified asthma, uncomplicated: Secondary | ICD-10-CM | POA: Diagnosis not present

## 2018-01-29 DIAGNOSIS — I10 Essential (primary) hypertension: Secondary | ICD-10-CM | POA: Diagnosis not present

## 2018-01-30 DIAGNOSIS — J45909 Unspecified asthma, uncomplicated: Secondary | ICD-10-CM | POA: Diagnosis not present

## 2018-01-30 DIAGNOSIS — L89310 Pressure ulcer of right buttock, unstageable: Secondary | ICD-10-CM | POA: Diagnosis not present

## 2018-01-30 DIAGNOSIS — L89312 Pressure ulcer of right buttock, stage 2: Secondary | ICD-10-CM | POA: Diagnosis not present

## 2018-01-30 DIAGNOSIS — I10 Essential (primary) hypertension: Secondary | ICD-10-CM | POA: Diagnosis not present

## 2018-01-30 DIAGNOSIS — E1121 Type 2 diabetes mellitus with diabetic nephropathy: Secondary | ICD-10-CM | POA: Diagnosis not present

## 2018-02-04 DIAGNOSIS — I2510 Atherosclerotic heart disease of native coronary artery without angina pectoris: Secondary | ICD-10-CM | POA: Diagnosis not present

## 2018-02-04 DIAGNOSIS — M1990 Unspecified osteoarthritis, unspecified site: Secondary | ICD-10-CM | POA: Diagnosis not present

## 2018-02-27 DIAGNOSIS — R1312 Dysphagia, oropharyngeal phase: Secondary | ICD-10-CM | POA: Diagnosis not present

## 2018-03-05 DIAGNOSIS — E1151 Type 2 diabetes mellitus with diabetic peripheral angiopathy without gangrene: Secondary | ICD-10-CM | POA: Diagnosis not present

## 2018-03-05 DIAGNOSIS — Z79899 Other long term (current) drug therapy: Secondary | ICD-10-CM | POA: Diagnosis not present

## 2018-03-05 DIAGNOSIS — E119 Type 2 diabetes mellitus without complications: Secondary | ICD-10-CM | POA: Diagnosis not present

## 2018-03-05 DIAGNOSIS — I10 Essential (primary) hypertension: Secondary | ICD-10-CM | POA: Diagnosis not present

## 2018-03-21 DEATH — deceased

## 2019-11-15 IMAGING — CT CT HEAD W/O CM
3 series · 16 of 47 positions shown, 19 images · non-contrast
Comparison: CT head 11/03/2017

CLINICAL DATA: Altered level of consciousness.

EXAM:
CT HEAD WITHOUT CONTRAST
TECHNIQUE: Contiguous axial images were obtained from the base of the skull
through the vertex without intravenous contrast.

[Series 2: head wo · axial · 0.47mm/px · z∈[-113,+12]mm · 10 of 30 slices shown, 13 images]
[im 3/30  brain]
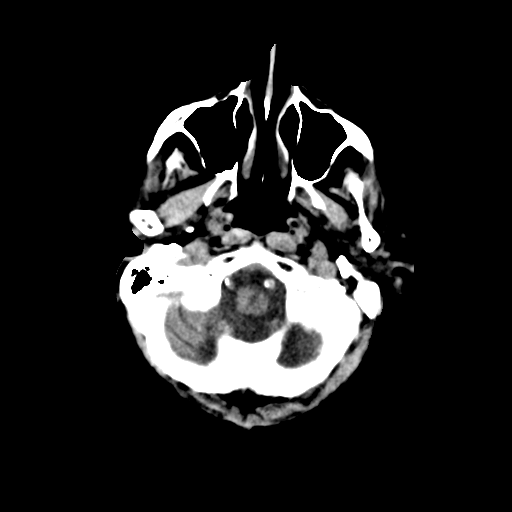
[im 3/30  bone]
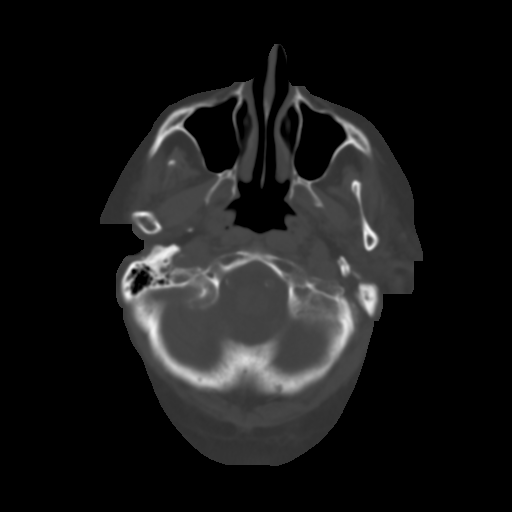
[im 6/30  brain]
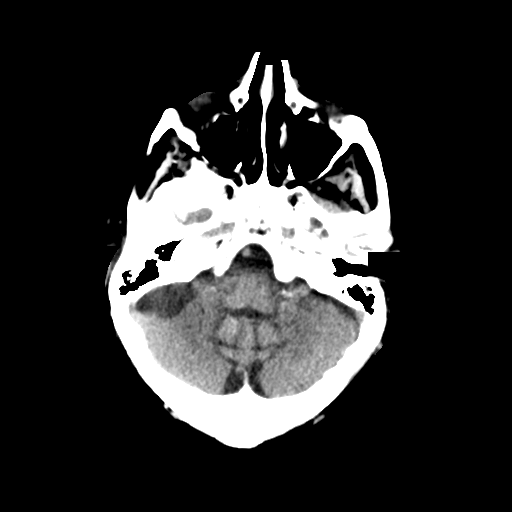
[im 9/30  brain]
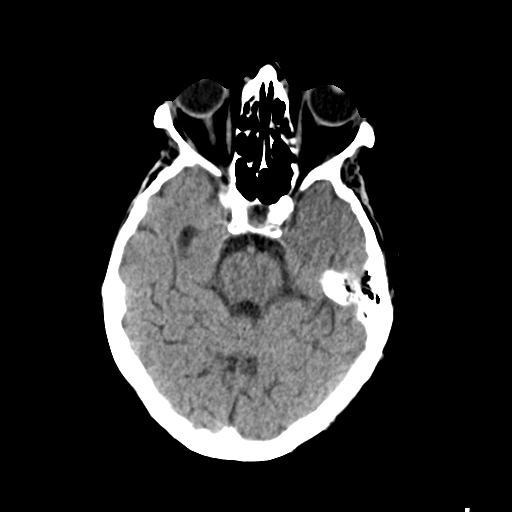
[im 11/30  brain]
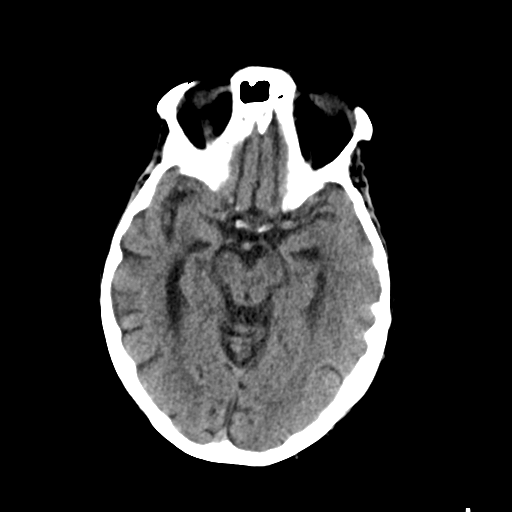
[im 14/30  brain]
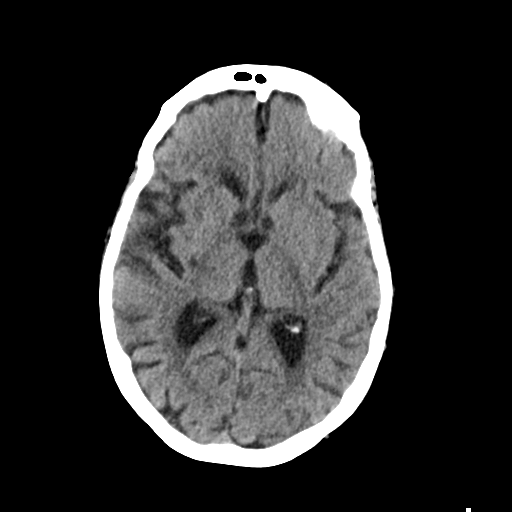
[im 14/30  bone]
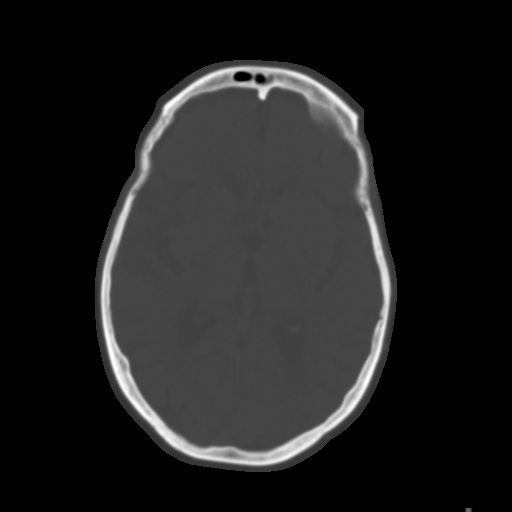
[im 17/30  brain]
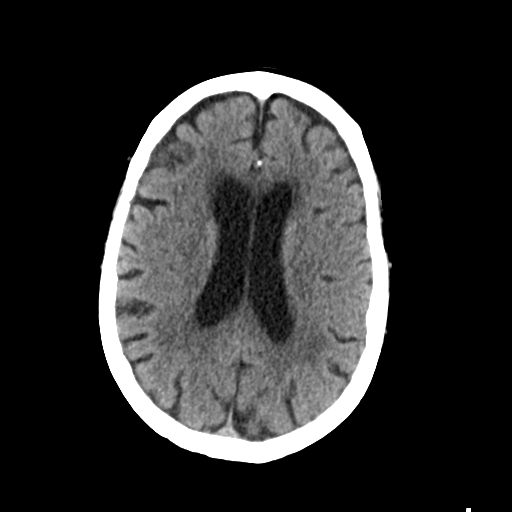
[im 20/30  brain]
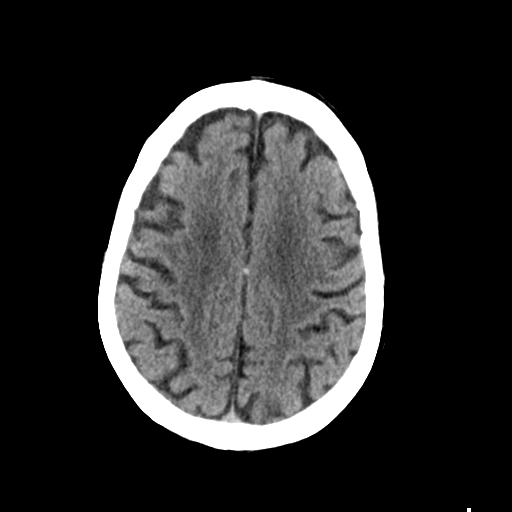
[im 23/30  brain]
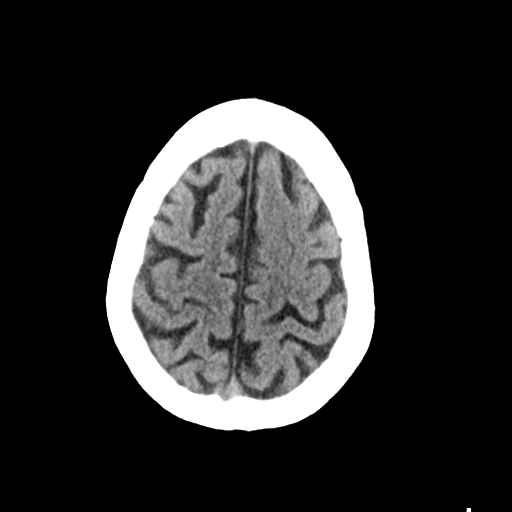
[im 25/30  brain]
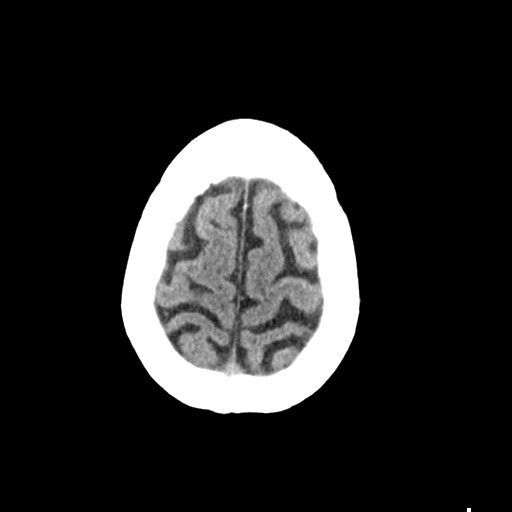
[im 25/30  bone]
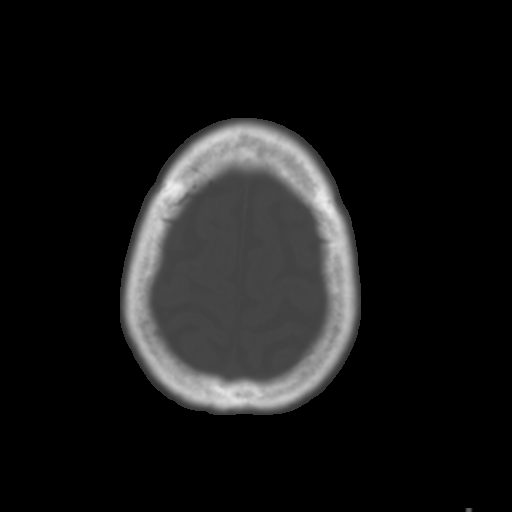
[im 28/30  brain]
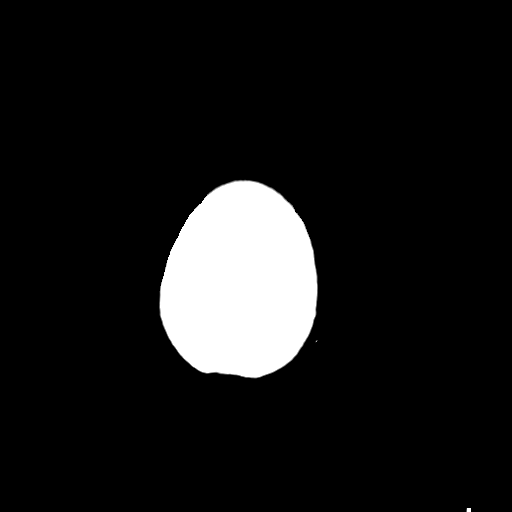

[Series 4: coronal soft tissue · coronal · 0.29mm/px · 3 of 75 slices shown]
[im 25/75  brain]
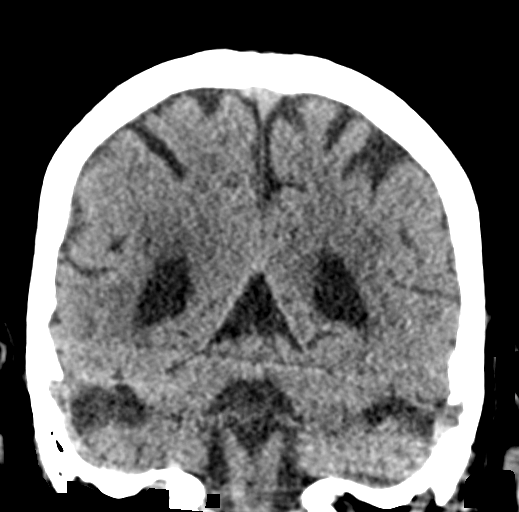
[im 33/75  brain]
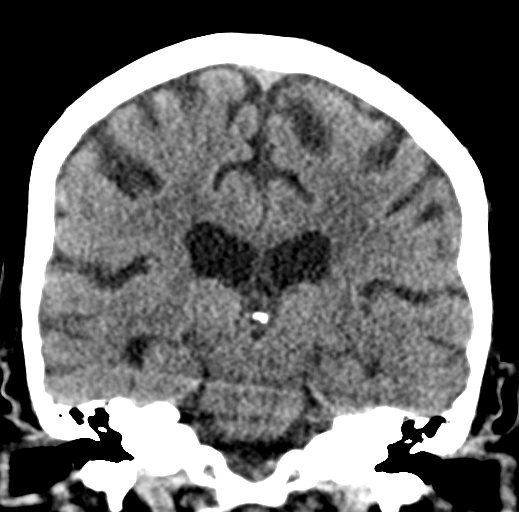
[im 42/75  brain]
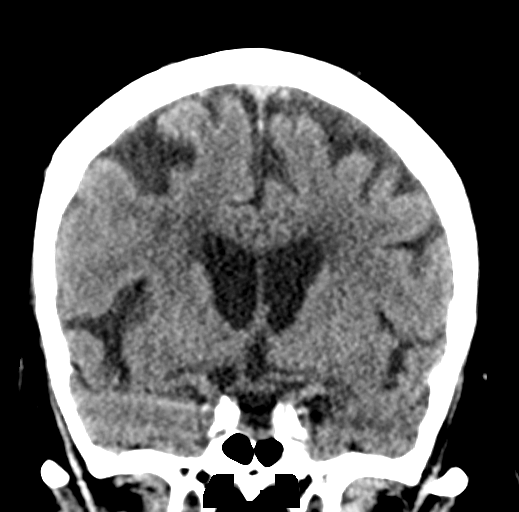

[Series 5: sagittal soft tissue · sagittal · 0.29mm/px · 3 of 50 slices shown]
[im 17/50  brain]
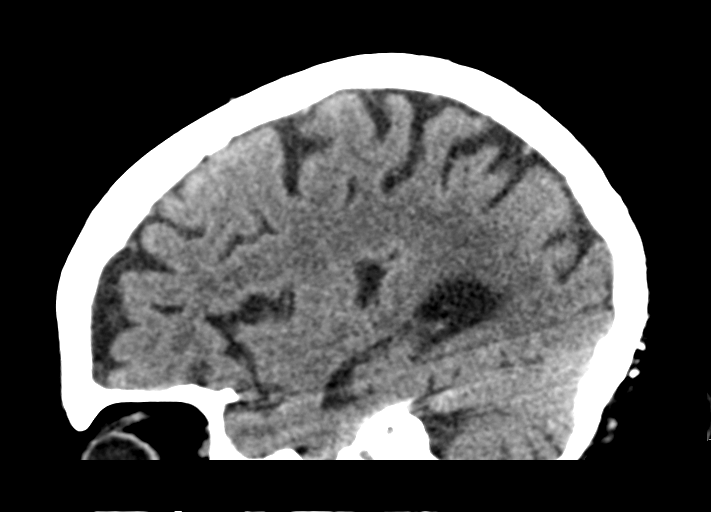
[im 25/50  brain]
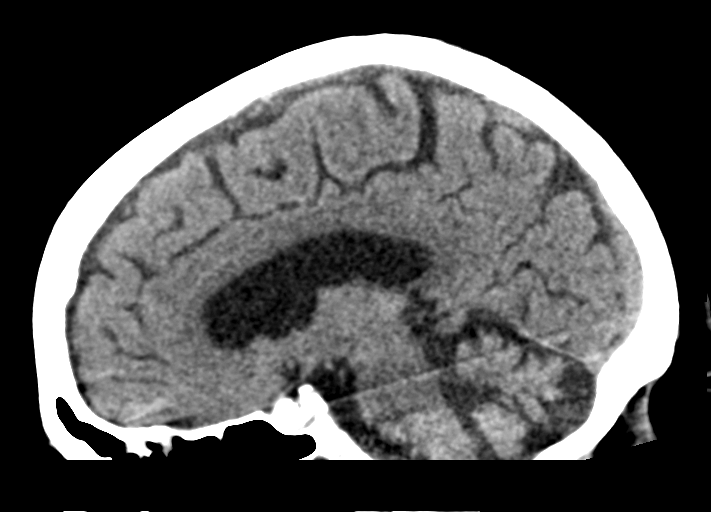
[im 33/50  brain]
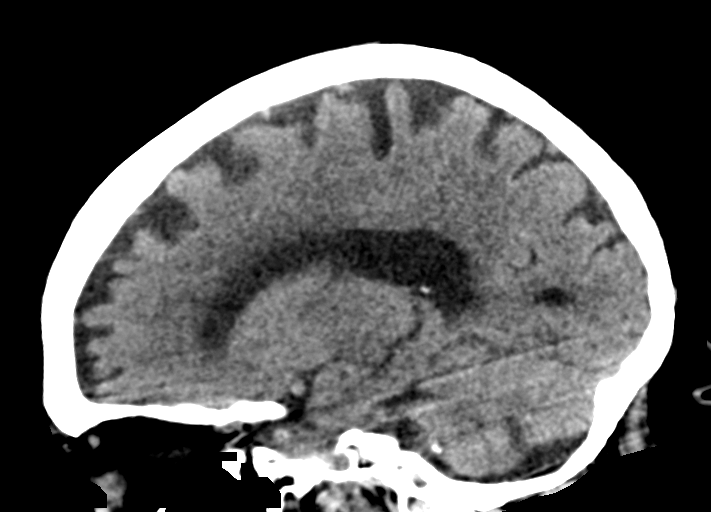

[16 of 47 positions shown; findings below may reference images not displayed]

FINDINGS: Brain: Moderate atrophy. Negative for hydrocephalus. Chronic
microvascular ischemic changes throughout the white matter are
stable.

Negative for acute infarct.  Negative for hemorrhage or mass

Vascular: Atherosclerotic calcification in the carotid siphon
bilaterally. Negative for acute thrombosis.

Skull: Negative

Sinuses/Orbits: Negative

Other: None
IMPRESSION: Atrophy and chronic microvascular ischemia. No acute abnormality and
no change from 5 days prior
